# Patient Record
Sex: Female | Born: 1965
Health system: Southern US, Community
[De-identification: ages and names within clinical notes are randomized; demographics above are authoritative.]

## PROBLEM LIST (undated history)

## (undated) DIAGNOSIS — K219 Gastro-esophageal reflux disease without esophagitis: Secondary | ICD-10-CM

## (undated) DIAGNOSIS — F419 Anxiety disorder, unspecified: Secondary | ICD-10-CM

## (undated) DIAGNOSIS — Z860100 Personal history of colon polyps, unspecified: Secondary | ICD-10-CM

## (undated) DIAGNOSIS — M1990 Unspecified osteoarthritis, unspecified site: Secondary | ICD-10-CM

## (undated) DIAGNOSIS — H9209 Otalgia, unspecified ear: Secondary | ICD-10-CM

## (undated) DIAGNOSIS — E079 Disorder of thyroid, unspecified: Secondary | ICD-10-CM

## (undated) HISTORY — DX: Anxiety disorder, unspecified: F41.9

## (undated) HISTORY — DX: Personal history of colonic polyps: Z86.010

## (undated) HISTORY — DX: Personal history of colon polyps, unspecified: Z86.0100

## (undated) HISTORY — DX: Unspecified osteoarthritis, unspecified site: M19.90

## (undated) HISTORY — DX: Disorder of thyroid, unspecified: E07.9

## (undated) HISTORY — PX: CHOLECYSTECTOMY: SHX55

## (undated) HISTORY — DX: Otalgia, unspecified ear: H92.09

## (undated) HISTORY — PX: OTHER SURGICAL HISTORY: SHX169

## (undated) HISTORY — DX: Gastro-esophageal reflux disease without esophagitis: K21.9

## (undated) HISTORY — PX: HERNIA REPAIR: SHX51

---

## 2000-04-02 ENCOUNTER — Other Ambulatory Visit: Admission: RE | Admit: 2000-04-02 | Discharge: 2000-04-02 | Payer: Self-pay | Admitting: Family Medicine

## 2001-05-27 ENCOUNTER — Encounter: Payer: Self-pay | Admitting: Obstetrics and Gynecology

## 2001-05-27 ENCOUNTER — Ambulatory Visit (HOSPITAL_COMMUNITY): Admission: RE | Admit: 2001-05-27 | Discharge: 2001-05-27 | Payer: Self-pay | Admitting: *Deleted

## 2001-08-11 ENCOUNTER — Ambulatory Visit (HOSPITAL_COMMUNITY): Admission: RE | Admit: 2001-08-11 | Discharge: 2001-08-11 | Payer: Self-pay | Admitting: Obstetrics and Gynecology

## 2001-08-11 ENCOUNTER — Encounter: Payer: Self-pay | Admitting: Obstetrics and Gynecology

## 2001-08-14 ENCOUNTER — Inpatient Hospital Stay (HOSPITAL_COMMUNITY): Admission: AD | Admit: 2001-08-14 | Discharge: 2001-08-16 | Payer: Self-pay | Admitting: Obstetrics and Gynecology

## 2001-09-18 ENCOUNTER — Other Ambulatory Visit: Admission: RE | Admit: 2001-09-18 | Discharge: 2001-09-18 | Payer: Self-pay | Admitting: Obstetrics and Gynecology

## 2004-04-23 ENCOUNTER — Ambulatory Visit (HOSPITAL_COMMUNITY): Admission: RE | Admit: 2004-04-23 | Discharge: 2004-04-23 | Payer: Self-pay | Admitting: Family Medicine

## 2004-08-26 ENCOUNTER — Emergency Department (HOSPITAL_COMMUNITY): Admission: EM | Admit: 2004-08-26 | Discharge: 2004-08-26 | Payer: Self-pay | Admitting: Emergency Medicine

## 2005-02-15 ENCOUNTER — Other Ambulatory Visit: Admission: RE | Admit: 2005-02-15 | Discharge: 2005-02-15 | Payer: Self-pay | Admitting: Obstetrics and Gynecology

## 2010-06-13 ENCOUNTER — Ambulatory Visit (HOSPITAL_COMMUNITY): Admission: RE | Admit: 2010-06-13 | Discharge: 2010-06-13 | Payer: Self-pay | Admitting: Surgery

## 2010-06-25 ENCOUNTER — Ambulatory Visit (HOSPITAL_COMMUNITY): Admission: RE | Admit: 2010-06-25 | Discharge: 2010-06-25 | Payer: Self-pay | Admitting: Surgery

## 2010-07-05 ENCOUNTER — Encounter: Admission: RE | Admit: 2010-07-05 | Discharge: 2010-08-23 | Payer: Self-pay | Admitting: Surgery

## 2010-07-10 ENCOUNTER — Ambulatory Visit (HOSPITAL_BASED_OUTPATIENT_CLINIC_OR_DEPARTMENT_OTHER): Admission: RE | Admit: 2010-07-10 | Discharge: 2010-07-10 | Payer: Self-pay | Admitting: Surgery

## 2010-07-15 ENCOUNTER — Ambulatory Visit: Payer: Self-pay | Admitting: Internal Medicine

## 2010-09-27 ENCOUNTER — Encounter
Admission: RE | Admit: 2010-09-27 | Discharge: 2010-12-25 | Payer: Self-pay | Source: Home / Self Care | Attending: Surgery | Admitting: Surgery

## 2010-10-16 ENCOUNTER — Ambulatory Visit (HOSPITAL_COMMUNITY)
Admission: RE | Admit: 2010-10-16 | Discharge: 2010-10-17 | Payer: Self-pay | Source: Home / Self Care | Admitting: Surgery

## 2010-10-30 ENCOUNTER — Encounter
Admission: RE | Admit: 2010-10-30 | Discharge: 2010-12-25 | Payer: Self-pay | Source: Home / Self Care | Attending: Surgery | Admitting: Surgery

## 2011-01-23 ENCOUNTER — Encounter: Admit: 2011-01-23 | Payer: Self-pay | Admitting: Surgery

## 2011-02-05 LAB — COMPREHENSIVE METABOLIC PANEL
ALT: 22 U/L (ref 0–35)
Albumin: 4 g/dL (ref 3.5–5.2)
BUN: 8 mg/dL (ref 6–23)
Calcium: 9.6 mg/dL (ref 8.4–10.5)
GFR calc non Af Amer: >60 mL/min (ref >60)
Glucose, Bld: 86 mg/dL (ref 70–99)
Potassium: 4.4 mEq/L (ref 3.5–5.1)

## 2011-02-05 LAB — DIFFERENTIAL
Basophils Absolute: 0 10*3/uL (ref 0.0–0.1)
Basophils Relative: 0 % (ref 0–1)
Eosinophils Relative: 0 % (ref 0–5)
Eosinophils Relative: 1 % (ref 0–5)
Lymphs Abs: 1 10*3/uL (ref 0.7–4.0)
Lymphs Abs: 1.1 10*3/uL (ref 0.7–4.0)
Monocytes Absolute: 0.5 10*3/uL (ref 0.1–1.0)
Monocytes Absolute: 0.7 10*3/uL (ref 0.1–1.0)
Monocytes Relative: 12 % (ref 3–12)
Neutro Abs: 3 10*3/uL (ref 1.7–7.7)
Neutrophils Relative %: 61 % (ref 43–77)

## 2011-02-05 LAB — CBC
Hemoglobin: 11.3 g/dL — ABNORMAL LOW (ref 12.0–15.0)
MCH: 27.8 pg (ref 26.0–34.0)
MCHC: 33.7 g/dL (ref 30.0–36.0)
MCV: 82.4 fL (ref 78.0–100.0)
Platelets: 160 10*3/uL (ref 150–400)
RDW: 15.7 % — ABNORMAL HIGH (ref 11.5–15.5)
WBC: 4.3 10*3/uL (ref 4.0–10.5)
WBC: 4.9 10*3/uL (ref 4.0–10.5)

## 2011-02-20 ENCOUNTER — Encounter: Payer: 59 | Attending: Surgery | Admitting: *Deleted

## 2011-02-20 DIAGNOSIS — Z9884 Bariatric surgery status: Secondary | ICD-10-CM | POA: Insufficient documentation

## 2011-02-20 DIAGNOSIS — Z713 Dietary counseling and surveillance: Secondary | ICD-10-CM | POA: Insufficient documentation

## 2011-02-20 DIAGNOSIS — Z09 Encounter for follow-up examination after completed treatment for conditions other than malignant neoplasm: Secondary | ICD-10-CM | POA: Insufficient documentation

## 2011-04-12 NOTE — Discharge Summary (Signed)
[facility]  Patient:    Stephanie Little, Stephanie Little Visit Number: [id number] MRN: [id number]          Service Type: OBS Location: 910A [id number] [id number] Attending Physician:  Melony Overly Admit Date:  08/14/2001 Discharge Date: 08/16/2001                             Discharge Summary  ADMISSION DIAGNOSES:          1. Intrauterine pregnancy at 39-4/7 weeks.                               2. Suspected macrosomia.  FINDINGS:                     1. Intrauterine pregnancy at 39-4/7 weeks.                               2. Macrosomia.                               3. Delivery of viable female infant weighing                                  8 pounds 11 ounces, Apgars 9 at one minute                                  and 9 at five minutes.  BRIEF HISTORY:                The patient is a 45 year old female, gravida 1, para 0, at 39-4/7 weeks who on ultrasound had an estimated fetal weight of 4096 grams, greater than 96th percentile.  There was no palpable presenting part noted in the pelvis and the cervix was noted to be long and thick.  In view of this suspected large infant and primigravida with no presenting part in the pelvis, the risks and benefits of attempted vaginal delivery versus cesarean section were discussed and the patient opted to undergo primary cesarean section.  HOSPITAL COURSE:              On August 14, 2001, she was taken to the operating room where under spinal anesthesia, a primary low transverse cesarean section was carried out with delivery of a viable female infant weighing 8 pounds 11 ounces.  The patients postoperative course was essentially uncomplicated.  She was tolerating increasing ambulation and diet well and by September 22, was in satisfactory condition and felt stable enough to be sent home.  DISCHARGE MEDICATIONS:        Tylox one every three to four hours as needed                               for pain.  DISCHARGE INSTRUCTIONS:        She was instructed to do no heavy lifting and to call if there were any problems such as fever, pain, or heavy bleeding.  The patient is to be seen back for follow-up examination in five to six weeks. Attending Physician:  Conley Simmonds  A DD:  09/09/01 TD:  09/09/01 Job: [id number]

## 2011-04-12 NOTE — Op Note (Signed)
[facility]  Patient:    Stephanie Little, Stephanie Little Visit Number: [id number] MRN: [id number]          Service Type: OBS Location: [id number] [id number] Attending Physician:  Melony Overly Dictated by:   Devoria Albe Edward Jolly, M.D. Proc. Date: 08/14/01 Admit Date:  08/14/2001                             Operative Report  PREOPERATIVE DIAGNOSES:       1. Intrauterine gestation at 39+4 weeks.                               2. Suspected fetal macrosomia.  POSTOPERATIVE DIAGNOSES:      1. Intrauterine gestation at 39+4 weeks.                               2. Suspected fetal macrosomia.  PROCEDURE:                    Primary low segment transverse cesarean section.  SURGEON:                      Brook A. Edward Jolly, M.D.  ASSISTANT:                    Gerlene Burdock D. Arlyce Dice, M.D.  ANESTHESIA:                   Spinal.  INDICATIONS FOR PROCEDURE:    The patient was a 45 year old gravida 1, para 39 Caucasian female at 39+[redacted] weeks gestation ([facility] August 17, 2001 by last menstrual period) who presented with an estimated fetal weight of 4096 g which was consistent with greater than the 90th percentile and a normal amniotic fluid index of 12.3 on August 11, 2001.  The fetus was noted to be vertex by ultrasound.  The cervix on examination was noted to be closed, long, and thick with no palpable presenting part in the pelvis.  The patients one hour GTT was normal during her antepartum course.  A discussion was held with the patient regarding the suspected fetal macrosomia and the poor Bishop score and a decision was made to proceed with a primary low segment transverse cesarean section after the risks, benefits, and alternatives were discussed with her.  FINDINGS:                     Viable female infant was delivered at 9:27 with Apgars of 9 at one minute and 9 at five minutes.  The weight was 8 pounds 11 ounces.  The placenta had the normal insertion of a three vessel cord.  The tubes,  ovaries, and uterus were normal.  SPECIMEN:                     None.  PROCEDURE:                    With an IV in place the patient was taken to the operating room after she was properly identified.  The patient received a spinal anesthetic and she was then placed in the supine position.  The abdomen was sterilely prepped and a Foley catheter sterilely placed inside the bladder.  She was then sterilely draped.  After adequate  anesthesia was ensured, a Pfannenstiel incision was created sharply with a scalpel.  This was carried down to the fascia using the scalpel and monopolar cautery.  The fascia was scored in the midline with the scalpel. The incision was carried out bilaterally using Mayo scissors.  The rectus fascia was separated rectus muscles using sharp dissection with the Mayo scissors.  The rectus muscles were then divided in the midline using the same both superiorly and inferiorly.  The parietoperitoneum was identified and grasped with two hemostat clamps.  It was entered sharply with the Metzenbaum scissors.  The incision was then extended cranially and caudally using the same.  The bladder retractor was then placed and the lower uterine segment was exposed.  The bladder flap was created sharply with Metzenbaum scissors.  A transverse incision was then created in the lower uterine segment using the scalpel and the incision was carried out bilaterally using bandage scissors. The membranes were ruptured with an Allis clamp and clear fluid was appreciated.  A hand was inserted and the vertex was elevated.  The vertex was noted to be high in the pelvis.  Therefore a vacuum was placed over the vertex.  Adequate pressure was applied and the vertex was delivered without difficulty.  The nares and mouth were suctioned.  The remainder of the infant was delivered.  The cord was doubly clamped and cut.  The infant was carried over to the awaiting pediatricians.  The infant was noted  to be vigorous.  Cord blood was obtained at this time.  The patient did receive cefotetan 1 g IV.  The placenta was manually extracted.  The patient received Pitocin 20 units IV.  A moistened lap pad was used to ensure that no remaining membranes were within the uterine cavity.  The uterus was then closed with a running locked suture of #1 Chromic.  Hemostasis was excellent.  The uterus which had been exteriorized for closure of the uterine incision was then returned to the peritoneal cavity.  The peritoneal cavity was irrigated and suctioned.  Hemostasis was noted to be adequate.  The fascia was therefore closed at this time with a running suture of 0 Vicryl.  Subcutaneous tissue was irrigated and found to be hemostatic.  Interrupted suture of 3-0 plain were placed in a subcutaneous layer followed by staples in the skin.  A sterile bandage was placed.  The uterus was expressed of any remaining clots.  The patient was escorted to the recovery room in stable and awake condition.  There were no complications of the procedure.  All needle, instrument, and lap counts were correct. Dictated by:   Devoria Albe Edward Jolly, M.D. Attending Physician:  Melony Overly DD:  08/14/01 TD:  08/14/01 Job: [id number]

## 2011-05-16 ENCOUNTER — Ambulatory Visit: Payer: 59 | Admitting: *Deleted

## 2011-06-27 ENCOUNTER — Encounter (INDEPENDENT_AMBULATORY_CARE_PROVIDER_SITE_OTHER): Payer: Self-pay

## 2011-06-28 ENCOUNTER — Encounter (INDEPENDENT_AMBULATORY_CARE_PROVIDER_SITE_OTHER): Payer: Self-pay

## 2011-06-28 ENCOUNTER — Ambulatory Visit (INDEPENDENT_AMBULATORY_CARE_PROVIDER_SITE_OTHER): Payer: 59 | Admitting: Physician Assistant

## 2011-06-28 NOTE — Progress Notes (Signed)
  HISTORY: Stephanie Little is a 45 y.o.female who received an AP-Standard lap-band in November 2011 by Dr. Daphine Deutscher. She comes in today with no complaints and with a 6 lb weight loss since her last appointment. She denies vomiting or regurgitation symptoms. Her hunger is in good control as are her portion sizes.   VITAL SIGNS: Filed Vitals:   06/28/11 0844  BP: 116/78    PHYSICAL EXAM: Physical exam reveals a very well-appearing 45 y.o.female in no apparent distress Neurologic: Awake, alert, oriented Psych: Bright affect, conversant Respiratory: Breathing even and unlabored. No stridor or wheezing Extremities: Atraumatic, good range of motion. Skin: Warm, Dry, no rashes Musculoskeletal: Normal gait, Joints normal  ASSESMENT: 45 y.o.  female  s/p AP-Standard lap-band. She is doing very well  PLAN: We opted to forego an adjustment today as her hunger is under good control and her portion sizes remain small. We'll have her back in 4-6 weeks or sooner if her hunger increases. She voiced understanding and agreement.

## 2011-06-28 NOTE — Patient Instructions (Signed)
Return as scheduled. Call for an appointment sooner if you notice increasing appetite/portion sizes or persistent hunger.

## 2011-07-26 ENCOUNTER — Encounter (INDEPENDENT_AMBULATORY_CARE_PROVIDER_SITE_OTHER): Payer: 59

## 2011-08-02 ENCOUNTER — Ambulatory Visit (INDEPENDENT_AMBULATORY_CARE_PROVIDER_SITE_OTHER): Payer: 59 | Admitting: Physician Assistant

## 2011-08-02 ENCOUNTER — Encounter (INDEPENDENT_AMBULATORY_CARE_PROVIDER_SITE_OTHER): Payer: Self-pay | Admitting: Physician Assistant

## 2011-08-02 NOTE — Patient Instructions (Signed)
Return in 1 month or sooner if necessary. Follow-up with Amy with Nutrition. Please call her for an appointment.

## 2011-08-02 NOTE — Progress Notes (Signed)
  HISTORY: Stephanie Little is a 46 y.o.female who received an AP-Standard lap-band in November 2011 by Dr. Daphine Deutscher. She comes in today with no complaints of hunger or increased portion sizes however she says her food choices have been less than ideal.  VITAL SIGNS: Filed Vitals:   08/02/11 0904  BP: 122/86  Pulse: 62    PHYSICAL EXAM: Physical exam reveals a very well-appearing 45 y.o.female in no apparent distress Neurologic: Awake, alert, oriented Psych: Bright affect, conversant Respiratory: Breathing even and unlabored. No stridor or wheezing Extremities: Atraumatic, good range of motion. Skin: Warm, Dry, no rashes Musculoskeletal: Normal gait, Joints normal  ASSESMENT: 45 y.o.  female  s/p AP-Standard lap-band.   PLAN: She describes being in the green zone although her choices at meals and throughout the day aren't great (i.e. Large diet coke every day, relying on protein bars, etc.). We'll have her back next month to check her progress in terms of food choices. Hopefully before then she'll have an opportunity to meet with Royal Hawthorn, LD and get back on track.

## 2011-08-30 ENCOUNTER — Encounter (INDEPENDENT_AMBULATORY_CARE_PROVIDER_SITE_OTHER): Payer: Self-pay

## 2011-08-30 ENCOUNTER — Ambulatory Visit (INDEPENDENT_AMBULATORY_CARE_PROVIDER_SITE_OTHER): Payer: 59 | Admitting: Physician Assistant

## 2011-08-30 NOTE — Progress Notes (Signed)
  HISTORY: Stephanie Little is a 45 y.o.female who received an AP-Standard lap-band in November 2011 by Dr. Daphine Deutscher. She comes in with no new complaints and a weight loss of nearly 4 lbs since her last visit. She denies persistent vomiting or regurgitation symptoms. Her work schedule is pretty intense and makes it difficult for her to get exercise.  VITAL SIGNS: Filed Vitals:   08/30/11 0924  BP: 106/70  Pulse: 60  Temp: 98 F (36.7 C)  Resp: 18    PHYSICAL EXAM: Physical exam reveals a very well-appearing 45 y.o.female in no apparent distress Neurologic: Awake, alert, oriented Psych: Bright affect, conversant Respiratory: Breathing even and unlabored. No stridor or wheezing Extremities: Atraumatic, good range of motion. Skin: Warm, Dry, no rashes Musculoskeletal: Normal gait, Joints normal  ASSESMENT: 45 y.o.  female  s/p AP-Standard lap-band.   PLAN: We deferred an adjustment at this time as she believes the band is working as expected. We'll have her return in one month for her one year anniversary appointment.

## 2011-08-30 NOTE — Patient Instructions (Signed)
Return in 1 month or sooner if needed.

## 2011-09-27 ENCOUNTER — Encounter (INDEPENDENT_AMBULATORY_CARE_PROVIDER_SITE_OTHER): Payer: Self-pay | Admitting: Physician Assistant

## 2011-09-27 ENCOUNTER — Ambulatory Visit (INDEPENDENT_AMBULATORY_CARE_PROVIDER_SITE_OTHER): Payer: 59 | Admitting: Physician Assistant

## 2011-09-27 VITALS — BP 110/74 | HR 72 | Resp 18 | Ht 70.0 in | Wt 241.2 lb

## 2011-09-27 DIAGNOSIS — E669 Obesity, unspecified: Secondary | ICD-10-CM

## 2011-09-27 DIAGNOSIS — Z4651 Encounter for fitting and adjustment of gastric lap band: Secondary | ICD-10-CM

## 2011-09-27 NOTE — Patient Instructions (Signed)
Take clear liquids for the next 48 hours. Thin protein shakes are ok to start on Saturday evening. Call us if you have persistent vomiting or regurgitation, night cough or reflux symptoms. Return as scheduled or sooner if you notice no changes in hunger/portion sizes.   

## 2011-09-27 NOTE — Progress Notes (Signed)
  HISTORY: Stephanie Little is a 45 y.o.female who received an AP-Standard lap-band in November 2011 by Dr. Daphine Deutscher. She says her portion sizes and hunger have increased with no increase in untoward symptoms. She would like an adjustment today.  VITAL SIGNS: Filed Vitals:   09/27/11 1020  BP: 110/74  Pulse: 72  Resp: 18    PHYSICAL EXAM: Physical exam reveals a very well-appearing 45 y.o.female in no apparent distress Neurologic: Awake, alert, oriented Psych: Bright affect, conversant Respiratory: Breathing even and unlabored. No stridor or wheezing Abdomen: Soft, nontender, nondistended to palpation. Incisions well-healed. No incisional hernias. Port easily palpated. Extremities: Atraumatic, good range of motion.  ASSESMENT: 45 y.o.  female  s/p AP-Standard lap-band.   PLAN: The patient's port was accessed with a 20G Huber needle without difficulty. Clear fluid was aspirated and 0.5 mL saline was added to the port. The patient was able to swallow water without difficulty following the procedure and was instructed to take clear liquids for the next 24-48 hours and advance slowly as tolerated.

## 2011-10-04 ENCOUNTER — Encounter (INDEPENDENT_AMBULATORY_CARE_PROVIDER_SITE_OTHER): Payer: 59

## 2011-10-25 ENCOUNTER — Ambulatory Visit (INDEPENDENT_AMBULATORY_CARE_PROVIDER_SITE_OTHER): Payer: 59 | Admitting: Physician Assistant

## 2011-10-25 ENCOUNTER — Encounter (INDEPENDENT_AMBULATORY_CARE_PROVIDER_SITE_OTHER): Payer: Self-pay

## 2011-10-25 VITALS — BP 114/76 | Ht 70.0 in | Wt 234.4 lb

## 2011-10-25 DIAGNOSIS — E669 Obesity, unspecified: Secondary | ICD-10-CM

## 2011-10-25 NOTE — Patient Instructions (Signed)
Return in 3 months or sooner if needed.

## 2011-10-25 NOTE — Progress Notes (Signed)
  HISTORY: Stephanie Little is a 45 y.o.female who received an AP-Standard lap-band in November 2011 by Dr. Daphine Deutscher. She comes in today with no new complaints. Her portion sizes are small and she's having no untoward symtpoms.  VITAL SIGNS: Filed Vitals:   10/25/11 1050  BP: 114/76    PHYSICAL EXAM: Physical exam reveals a very well-appearing 45 y.o.female in no apparent distress Neurologic: Awake, alert, oriented Psych: Bright affect, conversant Respiratory: Breathing even and unlabored. No stridor or wheezing Extremities: Atraumatic, good range of motion. Skin: Warm, Dry, no rashes Musculoskeletal: Normal gait, Joints normal  ASSESMENT: 45 y.o.  female  s/p AP-Standard lap-band.   PLAN: No adjustment today as her band is in the green zone. We'll have her back in 3 months for routine follow-up.

## 2011-12-26 ENCOUNTER — Encounter (INDEPENDENT_AMBULATORY_CARE_PROVIDER_SITE_OTHER): Payer: Self-pay | Admitting: General Surgery

## 2012-02-27 ENCOUNTER — Encounter (INDEPENDENT_AMBULATORY_CARE_PROVIDER_SITE_OTHER): Payer: Self-pay

## 2012-02-27 ENCOUNTER — Ambulatory Visit (INDEPENDENT_AMBULATORY_CARE_PROVIDER_SITE_OTHER): Payer: 59 | Admitting: Physician Assistant

## 2012-02-27 NOTE — Progress Notes (Signed)
  HISTORY: Stephanie Little is a 46 y.o.female who received an AP-Standard lap-band in November 2011 by Dr. Daphine Deutscher. She comes in with no complaints of regurgitation or vomiting. She says her thyroid medication is still being adjusted and believes this may be contributing to her slow weight loss. She isn't particularly hungry and isn't eating large volumes of food. She doesn't believe an adjustment is needed today.Marland Kitchen  VITAL SIGNS: Filed Vitals:   02/27/12 0846  BP: 108/67  Pulse: 76  Temp: 98 F (36.7 C)    PHYSICAL EXAM: Physical exam reveals a very well-appearing 46 y.o.female in no apparent distress Neurologic: Awake, alert, oriented Psych: Bright affect, conversant Respiratory: Breathing even and unlabored. No stridor or wheezing Extremities: Atraumatic, good range of motion. Skin: Warm, Dry, no rashes Musculoskeletal: Normal gait, Joints normal  ASSESMENT: 46 y.o.  female  s/p AP-Standard lap-band.   PLAN: We deferred an adjustment today. We'll have her back in three months or sooner if needed.

## 2012-02-27 NOTE — Patient Instructions (Signed)
Return in 3 months or sooner if needed.

## 2012-05-21 ENCOUNTER — Encounter (INDEPENDENT_AMBULATORY_CARE_PROVIDER_SITE_OTHER): Payer: 59

## 2012-06-25 ENCOUNTER — Encounter (INDEPENDENT_AMBULATORY_CARE_PROVIDER_SITE_OTHER): Payer: Self-pay

## 2012-06-25 ENCOUNTER — Ambulatory Visit (INDEPENDENT_AMBULATORY_CARE_PROVIDER_SITE_OTHER): Payer: 59 | Admitting: Physician Assistant

## 2012-06-25 NOTE — Patient Instructions (Signed)
Return in three months or sooner if needed, especially if you have difficulty swallowing.

## 2012-06-25 NOTE — Progress Notes (Signed)
  HISTORY: Stephanie Little is a 46 y.o.female who received an AP-Standard lap-band in November 2011 by Dr. Daphine Deutscher. She comes in with improvement in what appeared to be over-restriction. She said she had difficulty getting solids down but after her vacation she's able to eat without difficulty. She believes her dysphagia may be stress related.  VITAL SIGNS: Filed Vitals:   06/25/12 0852  BP: 126/82  Pulse: 72  Resp: 14    PHYSICAL EXAM: Physical exam reveals a very well-appearing 46 y.o.female in no apparent distress Neurologic: Awake, alert, oriented Psych: Bright affect, conversant Respiratory: Breathing even and unlabored. No stridor or wheezing Extremities: Atraumatic, good range of motion. Skin: Warm, Dry, no rashes Musculoskeletal: Normal gait, Joints normal  ASSESMENT: 46 y.o.  female  s/p AP-Standard lap-band.   PLAN: She sounds like she's in the green zone now so we'll leave the current fill level as is. I asked her to return in 3 months or sooner if needed. She voiced understanding and agreement.

## 2012-10-01 ENCOUNTER — Encounter (INDEPENDENT_AMBULATORY_CARE_PROVIDER_SITE_OTHER): Payer: 59

## 2012-10-08 ENCOUNTER — Encounter (INDEPENDENT_AMBULATORY_CARE_PROVIDER_SITE_OTHER): Payer: Self-pay

## 2012-10-08 ENCOUNTER — Ambulatory Visit (INDEPENDENT_AMBULATORY_CARE_PROVIDER_SITE_OTHER): Payer: 59 | Admitting: Physician Assistant

## 2012-10-08 DIAGNOSIS — Z9884 Bariatric surgery status: Secondary | ICD-10-CM

## 2012-10-08 NOTE — Progress Notes (Signed)
  HISTORY: Stephanie Little is a 46 y.o.female who received an AP-Standard lap-band in November 2011 by Dr. Daphine Deutscher. She comes in with occasional solid food dysphagia. She has undergone treatment for a sinus infection including prednisone which she has now discontinued. She has issues with solids while at work primarily. Weekends are more tolerable. She doesn't want fluid removed as now she's off the medication. She's planning on engaging in an exercise program as well.  VITAL SIGNS: Filed Vitals:   10/08/12 0925  BP: 112/76  Pulse: 75  Temp: 98 F (36.7 C)  Resp: 18    PHYSICAL EXAM: Physical exam reveals a very well-appearing 46 y.o.female in no apparent distress Neurologic: Awake, alert, oriented Psych: Bright affect, conversant Respiratory: Breathing even and unlabored. No stridor or wheezing Extremities: Atraumatic, good range of motion. Skin: Warm, Dry, no rashes Musculoskeletal: Normal gait, Joints normal  ASSESMENT: 46 y.o.  female  s/p AP-Standard lap-band.   PLAN: We deferred an adjustment today with an understanding that if her symptoms didn't progressively improve over the next week, she'll return for an adjustment. Otherwise we'll have her back in three months.

## 2012-10-08 NOTE — Patient Instructions (Signed)
Return in 3 months. Focus on good food choices as well as physical activity. Return sooner if you have an increase in hunger, portion sizes or weight. Return also for difficulty swallowing, night cough, reflux. 

## 2012-12-02 NOTE — Progress Notes (Signed)
Pt not seen by me

## 2013-01-07 ENCOUNTER — Encounter (INDEPENDENT_AMBULATORY_CARE_PROVIDER_SITE_OTHER): Payer: 59

## 2013-01-14 ENCOUNTER — Encounter (INDEPENDENT_AMBULATORY_CARE_PROVIDER_SITE_OTHER): Payer: 59

## 2013-02-18 ENCOUNTER — Encounter (INDEPENDENT_AMBULATORY_CARE_PROVIDER_SITE_OTHER): Payer: Self-pay

## 2013-02-18 ENCOUNTER — Ambulatory Visit (INDEPENDENT_AMBULATORY_CARE_PROVIDER_SITE_OTHER): Payer: 59 | Admitting: Physician Assistant

## 2013-02-18 VITALS — BP 110/84 | HR 82 | Temp 98.0°F | Resp 18 | Ht 70.0 in | Wt 230.6 lb

## 2013-02-18 DIAGNOSIS — Z4651 Encounter for fitting and adjustment of gastric lap band: Secondary | ICD-10-CM

## 2013-02-18 NOTE — Progress Notes (Signed)
  HISTORY: Stephanie Little is a 47 y.o.female who received an AP-Standard lap-band in November 2011 by Dr. Daphine Deutscher. She comes in with some evidence of over-restriction. She is eating relatively little solid food as it tends to get stuck. She's eating a fair bit of sweets. Vegetables are difficult and lean chicken is intolerable. She's concerned about weight gain but she'd like to be able to eat more nutritious foods as well.  VITAL SIGNS: Filed Vitals:   02/18/13 0847  BP: 110/84  Pulse: 82  Temp: 98 F (36.7 C)  Resp: 18    PHYSICAL EXAM: Physical exam reveals a very well-appearing 47 y.o.female in no apparent distress Neurologic: Awake, alert, oriented Psych: Bright affect, conversant Respiratory: Breathing even and unlabored. No stridor or wheezing Abdomen: Soft, nontender, nondistended to palpation. Incisions well-healed. No incisional hernias. Port easily palpated. Extremities: Atraumatic, good range of motion.  ASSESMENT: 47 y.o.  female  s/p AP-Standard lap-band.   PLAN: The patient's port was accessed with a 20G Huber needle without difficulty. Clear fluid was aspirated and 0.25 mL saline was removed from the port. We'll have her back in three months or sooner if needed.

## 2013-02-18 NOTE — Patient Instructions (Signed)
Take clear liquids tonight. Thin protein shakes are ok to start tomorrow morning. Slowly advance your diet thereafter. Call us if you have persistent vomiting or regurgitation, night cough or reflux symptoms. Return as scheduled or sooner if you notice no changes in hunger/portion sizes.  

## 2013-02-24 ENCOUNTER — Emergency Department (HOSPITAL_COMMUNITY): Payer: 59

## 2013-02-24 ENCOUNTER — Emergency Department (HOSPITAL_COMMUNITY)
Admission: EM | Admit: 2013-02-24 | Discharge: 2013-02-24 | Disposition: A | Discharge status: Home / Self Care | Payer: 59 | Attending: Emergency Medicine | Admitting: Emergency Medicine

## 2013-02-24 ENCOUNTER — Encounter (HOSPITAL_COMMUNITY): Payer: Self-pay | Admitting: *Deleted

## 2013-02-24 DIAGNOSIS — Z8669 Personal history of other diseases of the nervous system and sense organs: Secondary | ICD-10-CM | POA: Insufficient documentation

## 2013-02-24 DIAGNOSIS — R1013 Epigastric pain: Secondary | ICD-10-CM | POA: Insufficient documentation

## 2013-02-24 DIAGNOSIS — Z9884 Bariatric surgery status: Secondary | ICD-10-CM | POA: Insufficient documentation

## 2013-02-24 DIAGNOSIS — R110 Nausea: Secondary | ICD-10-CM | POA: Insufficient documentation

## 2013-02-24 DIAGNOSIS — Z79899 Other long term (current) drug therapy: Secondary | ICD-10-CM | POA: Insufficient documentation

## 2013-02-24 DIAGNOSIS — Z9089 Acquired absence of other organs: Secondary | ICD-10-CM | POA: Insufficient documentation

## 2013-02-24 DIAGNOSIS — Z9889 Other specified postprocedural states: Secondary | ICD-10-CM | POA: Insufficient documentation

## 2013-02-24 DIAGNOSIS — Z8719 Personal history of other diseases of the digestive system: Secondary | ICD-10-CM | POA: Insufficient documentation

## 2013-02-24 DIAGNOSIS — E079 Disorder of thyroid, unspecified: Secondary | ICD-10-CM | POA: Insufficient documentation

## 2013-02-24 DIAGNOSIS — R55 Syncope and collapse: Secondary | ICD-10-CM | POA: Insufficient documentation

## 2013-02-24 DIAGNOSIS — R109 Unspecified abdominal pain: Secondary | ICD-10-CM

## 2013-02-24 LAB — POCT I-STAT, CHEM 8
BUN: 7 mg/dL (ref 6–23)
Calcium, Ion: 1.1 mmol/L — ABNORMAL LOW (ref 1.12–1.23)
Creatinine, Ser: 0.6 mg/dL (ref 0.50–1.10)
Glucose, Bld: 96 mg/dL (ref 70–99)
HCT: 35 % — ABNORMAL LOW (ref 36.0–46.0)
Hemoglobin: 11.9 g/dL — ABNORMAL LOW (ref 12.0–15.0)
Potassium: 3.7 mEq/L (ref 3.5–5.1)

## 2013-02-24 LAB — CBC WITH DIFFERENTIAL/PLATELET
Basophils Absolute: 0 10*3/uL (ref 0.0–0.1)
Basophils Relative: 0 % (ref 0–1)
Eosinophils Absolute: 0.1 10*3/uL (ref 0.0–0.7)
Eosinophils Relative: 1 % (ref 0–5)
MCH: 26.5 pg (ref 26.0–34.0)
MCV: 78.2 fL (ref 78.0–100.0)
Monocytes Relative: 11 % (ref 3–12)
Neutrophils Relative %: 68 % (ref 43–77)

## 2013-02-24 LAB — HEPATIC FUNCTION PANEL
ALT: 19 U/L (ref 0–35)
Albumin: 3.3 g/dL — ABNORMAL LOW (ref 3.5–5.2)

## 2013-02-24 MED ORDER — ONDANSETRON HCL 4 MG/2ML IJ SOLN
4.0000 mg | Freq: Once | INTRAMUSCULAR | Status: AC
  Administered 2013-02-24: 4 mg via INTRAVENOUS
  Filled 2013-02-24: qty 2

## 2013-02-24 MED ORDER — SODIUM CHLORIDE 0.9 % IV BOLUS (SEPSIS)
1000.0000 mL | Freq: Once | INTRAVENOUS | Status: AC
  Administered 2013-02-24: 1000 mL via INTRAVENOUS
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
## 2013-02-24 NOTE — ED Provider Notes (Signed)
History     CSN: [id number]  Arrival date & time 02/24/13  1117   First MD Initiated Contact with Patient 02/24/13 1201      Chief Complaint  Patient presents with  . Loss of Consciousness    (Consider location/radiation/quality/duration/timing/severity/associated sxs/prior treatment) Patient is a 47 y.o. female presenting with syncope.  Loss of Consciousness    Pt with history of lap band had some fluid removed from her port about a week ago to help with over-restriction symptoms. She reports she has had difficulty with diet compliance due to increased hours at work. She had an episode this morning of severe sharp epigastric pain, associated with nausea and followed by syncopal episode. She was unconscious for a brief time, woke up feeling generally weak and nauseated. Pain has since resolved. She was reportedly hypotensive when EMS arrival. BP normalized enroute with 500cc NS. She is feeling better now.   Past Medical History  Diagnosis Date  . Thyroid disease   . Hemorrhoids   . Ear discomfort     fluid in ears    Past Surgical History  Procedure Laterality Date  . Cesarean section    . Cholecystectomy    . Laproscopic gastric banding    . Hernia repair      No family history on file.  History  Substance Use Topics  . Smoking status: Never Smoker   . Smokeless tobacco: Never Used  . Alcohol Use: No    OB History   Grav Para Term Preterm Abortions TAB SAB Ect Mult Living                  Review of Systems  Cardiovascular: Positive for syncope.   All other systems reviewed and are negative except as noted in HPI.   Allergies  Review of patient's allergies indicates no known allergies.  Home Medications   Current Outpatient Rx  Name  Route  Sig  Dispense  Refill  . citalopram (CELEXA) 10 MG tablet   Oral   Take 10 mg by mouth daily.         Marland Kitchen ibuprofen (ADVIL,MOTRIN) 800 MG tablet   Oral   Take 800 mg by mouth once as needed for pain.          Marland Kitchen levothyroxine (SYNTHROID, LEVOTHROID) 88 MCG tablet   Oral   Take 88 mcg by mouth daily before breakfast.           BP 110/64  Pulse 71  Temp(Src) 97.7 F (36.5 C) (Oral)  Resp 11  SpO2 97%  LMP 02/03/2013  Physical Exam  Nursing note and vitals reviewed. Constitutional: She is oriented to person, place, and time. She appears well-developed and well-nourished.  HENT:  Head: Normocephalic and atraumatic.  Eyes: EOM are normal. Pupils are equal, round, and reactive to light.  Neck: Normal range of motion. Neck supple.  Cardiovascular: Normal rate, normal heart sounds and intact distal pulses.   Pulmonary/Chest: Effort normal and breath sounds normal.  Abdominal: Bowel sounds are normal. She exhibits no distension. There is no tenderness.  Musculoskeletal: Normal range of motion. She exhibits no edema and no tenderness.  Neurological: She is alert and oriented to person, place, and time. She has normal strength. No cranial nerve deficit or sensory deficit.  Skin: Skin is warm and dry. No rash noted.  Psychiatric: She has a normal mood and affect.    ED Course  Procedures (including critical care time)  Labs Reviewed  CBC WITH  DIFFERENTIAL - Abnormal; Notable for the following:    Hemoglobin 11.3 (*)    HCT 33.4 (*)    All other components within normal limits  HEPATIC FUNCTION PANEL - Abnormal; Notable for the following:    Albumin 3.3 (*)    Total Bilirubin 0.2 (*)    All other components within normal limits  POCT I-STAT, CHEM 8 - Abnormal; Notable for the following:    Calcium, Ion 1.10 (*)    Hemoglobin 11.9 (*)    HCT 35.0 (*)    All other components within normal limits  GLUCOSE, CAPILLARY  LIPASE, BLOOD   Dg Chest 2 View  02/24/2013  **ADDENDUM** CREATED: 02/24/2013 14:29:17  The patient's gastric band is identified within the left upper quadrant of the abdomen.  This is oriented in the 2 o'clock to 8 o'clock position.  The band tubing appears intact.  **END  ADDENDUM** SIGNED BY: Rosealee Albee, M.D.   02/24/2013  *RADIOLOGY REPORT*  Clinical Data: Epigastric pain  CHEST - 2 VIEW  Comparison: 05/26/2010  Findings: The heart size and mediastinal contours are within normal limits.  Both lungs are clear.  The visualized skeletal structures are unremarkable.  IMPRESSION: Negative examination.   Original Report Authenticated By: Signa Kell, M.D.    Dg Abd 2 Views  02/24/2013  *RADIOLOGY REPORT*  Clinical Data: Abdominal pain  ABDOMEN - 2 VIEW  Comparison: 10/17/2010  Findings: There is nonspecific nonobstructive bowel gas pattern. Partially visualized gastric banding device tubing.  IMPRESSION: Nonspecific nonobstructive bowel gas pattern.   Original Report Authenticated By: Natasha Mead, M.D.      1. Abdominal pain   2. Vasovagal syncope       MDM  Abd benign now. Suspect this was a vaso-vagal syncope. Will check for cause of epigastric pain, AAS to assess position of Lap Band.    Date: 02/24/2013  Rate: 68  Rhythm: normal sinus rhythm  QRS Axis: normal  Intervals: normal  ST/T Wave abnormalities: normal  Conduction Disutrbances: none  Narrative Interpretation: unremarkable  2:50 PM Pt feeling much better. Xrays neg as above. She has been walking in the department without difficulty. Ready to go home. Advised to follow prescribed diet from Gen Surg and follow up with Dr. Daphine Deutscher as needed.         Charles B. Bernette Mayers, MD 02/24/13 1510

## 2013-02-24 NOTE — ED Notes (Signed)
Pt denies any dizziness, nausea while ambulating.  States feeling MUCH better.

## 2013-05-20 ENCOUNTER — Encounter (INDEPENDENT_AMBULATORY_CARE_PROVIDER_SITE_OTHER): Payer: Self-pay

## 2013-05-20 ENCOUNTER — Ambulatory Visit (INDEPENDENT_AMBULATORY_CARE_PROVIDER_SITE_OTHER): Payer: 59 | Admitting: Physician Assistant

## 2013-05-20 VITALS — BP 132/78 | HR 98 | Temp 98.6°F | Resp 16 | Ht 70.0 in | Wt 240.0 lb

## 2013-05-20 DIAGNOSIS — Z4651 Encounter for fitting and adjustment of gastric lap band: Secondary | ICD-10-CM

## 2013-05-20 NOTE — Progress Notes (Signed)
  HISTORY: Stephanie Little is a 47 y.o.female who received an AP-Standard lap-band in November 2011 by Dr. Daphine Deutscher. She comes in with 10 lbs weight gain since her last visit. She admits to eating more carbs, especially at the office. Her primary complaint, however, is persistent hunger. She has no regurgitation or reflux whatsoever. She wants a small fill today to get her weight under control. She had a syncopal episode at work associated with nausea, vomiting and diaphoresis. She relates being hypotensive at the hospital, with improvement from IV hydration. She denies regurgitation as the cause.  VITAL SIGNS: Filed Vitals:   05/20/13 0838  BP: 132/78  Pulse: 98  Temp: 98.6 F (37 C)  Resp: 16    PHYSICAL EXAM: Physical exam reveals a very well-appearing 47 y.o.female in no apparent distress Neurologic: Awake, alert, oriented Psych: Bright affect, conversant Respiratory: Breathing even and unlabored. No stridor or wheezing Abdomen: Soft, nontender, nondistended to palpation. Incisions well-healed. No incisional hernias. Port easily palpated. Extremities: Atraumatic, good range of motion.  ASSESMENT: 47 y.o.  female  s/p AP-Standard lap-band.   PLAN: The patient's port was accessed with a 20G Huber needle without difficulty. Clear fluid was aspirated and 0.25 mL saline was added to the port. The patient was able to swallow water without difficulty following the procedure and was instructed to take clear liquids for the next 24-48 hours and advance slowly as tolerated.

## 2013-05-20 NOTE — Patient Instructions (Signed)
Take clear liquids tonight. Thin protein shakes are ok to start tomorrow morning. Slowly advance your diet thereafter. Call us if you have persistent vomiting or regurgitation, night cough or reflux symptoms. Return as scheduled or sooner if you notice no changes in hunger/portion sizes.  

## 2013-08-19 ENCOUNTER — Encounter (INDEPENDENT_AMBULATORY_CARE_PROVIDER_SITE_OTHER): Payer: 59

## 2017-01-17 DIAGNOSIS — Z1231 Encounter for screening mammogram for malignant neoplasm of breast: Secondary | ICD-10-CM | POA: Diagnosis not present

## 2017-02-14 DIAGNOSIS — M25561 Pain in right knee: Secondary | ICD-10-CM | POA: Diagnosis not present

## 2017-05-15 ENCOUNTER — Encounter (HOSPITAL_COMMUNITY): Payer: Self-pay

## 2017-08-19 DIAGNOSIS — Z23 Encounter for immunization: Secondary | ICD-10-CM | POA: Diagnosis not present

## 2017-11-10 DIAGNOSIS — Z0000 Encounter for general adult medical examination without abnormal findings: Secondary | ICD-10-CM | POA: Diagnosis not present

## 2017-11-10 DIAGNOSIS — E039 Hypothyroidism, unspecified: Secondary | ICD-10-CM | POA: Diagnosis not present

## 2017-11-17 DIAGNOSIS — E039 Hypothyroidism, unspecified: Secondary | ICD-10-CM | POA: Diagnosis not present

## 2018-01-19 DIAGNOSIS — Z1231 Encounter for screening mammogram for malignant neoplasm of breast: Secondary | ICD-10-CM | POA: Diagnosis not present

## 2018-02-04 DIAGNOSIS — M25561 Pain in right knee: Secondary | ICD-10-CM | POA: Diagnosis not present

## 2018-08-11 DIAGNOSIS — Z23 Encounter for immunization: Secondary | ICD-10-CM | POA: Diagnosis not present

## 2018-10-19 DIAGNOSIS — M25561 Pain in right knee: Secondary | ICD-10-CM | POA: Diagnosis not present

## 2018-10-28 ENCOUNTER — Ambulatory Visit (INDEPENDENT_AMBULATORY_CARE_PROVIDER_SITE_OTHER): Payer: 59 | Admitting: Mental Health

## 2018-10-28 ENCOUNTER — Encounter: Payer: Self-pay | Admitting: Mental Health

## 2018-10-28 DIAGNOSIS — F411 Generalized anxiety disorder: Secondary | ICD-10-CM | POA: Diagnosis not present

## 2018-10-28 NOTE — Progress Notes (Signed)
[facility] Counselor Initial Adult Exam- Part I  Name: Stephanie Little Date: 10/28/2018 MRN: [id number] DOB: 1966/10/20 PCP: Ronita Hipps, MD  Time spent: 45 minutes   Guardian/Payee:patient    Paperwork requested:  No    Mental Status Exam:   Appearance:   Well Groomed     Behavior:  Agitated  Motor:  Restlestness  Speech/Language:   Clear and Coherent  Affect:  frustrated, upset  Mood:  anxious, depressed and irritable  Thought process:  normal  Thought content:    WNL  Sensory/Perceptual disturbances:    WNL  Orientation:  oriented to person, place and time/date  Attention:  Good  Concentration:  Good  Memory:  WNL  Fund of knowledge:   Good  Insight:    Good  Judgment:   Good  Impulse Control:  Good   Reported Symptoms:  Fatigue and upset  Risk Assessment: Danger to Self:  No Self-injurious Behavior: No Danger to Others: No Duty to Warn:no Physical Aggression / Violence:No  Access to Firearms a concern: No  Gang Involvement:No  Patient / guardian was educated about steps to take if suicide or homicide risk level increases between visits: no While future psychiatric events cannot be accurately predicted, the patient does not currently require acute inpatient psychiatric care and does not currently meet [facility] involuntary commitment criteria.  Substance Abuse History: Current substance abuse: No     Past Psychiatric History:   Previous psychological history is significant for anxiety Outpatient Providers:Dr. Jeral Pinch History of Psych Hospitalization: No  Psychological Testing: none    Medical History/Surgical History: Past Medical History:  Diagnosis Date  . Ear discomfort    fluid in ears  . Hemorrhoids   . Thyroid disease     Past Surgical History:  Procedure Laterality Date  . CESAREAN SECTION    . CHOLECYSTECTOMY    . HERNIA REPAIR    . laproscopic gastric banding      Medications: Current Outpatient Medications  Medication  Sig Dispense Refill  . citalopram (CELEXA) 10 MG tablet Take 10 mg by mouth daily.    Marland Kitchen ibuprofen (ADVIL,MOTRIN) 800 MG tablet Take 800 mg by mouth once as needed for pain.    Marland Kitchen levothyroxine (SYNTHROID, LEVOTHROID) 88 MCG tablet Take 88 mcg by mouth daily before breakfast.     No current facility-administered medications for this visit.     No Known Allergies  Diagnoses:    ICD-10-CM   1. Generalized anxiety disorder F41.1      Part II to be continued at next session:   No.   Rosary Lively, LPC                 Abuse History: Victim: none Report needed: no Perpetrator of abuse: no Witness / Exposure to Domestic Violence:  none Protective Services Involvement: no Witness to [employer] Violence:  no   Family / Social History:    Living situation: married, living with spouse Sexual Orientation: straight Relationship Status:   married Name of spouse / other: Stephanie Little Psychologist, counselling) If a parent, number of children / ages:   One son  Support Systems: Training and development officer Stress:   Expenses for son  Income/Employment/Disability:   Biochemist, clinical Service: No  Educational History:   [employer] and [employer]  Religion/Sprituality/World View:    Christian  Any cultural differences that may affect / interfere with treatment:  No  Recreation/Hobbies: Reading  Stressors:  Father has prostate cancer, son is reellious  Strengths:  Faith, Friendships  Barriers: none  Legal History: None personally  Pending legal issue / charges: none  History of legal issue / charges: son arrested for possession of paraphenalia, has o go to court  Diagnosis:  Generalized anxiety

## 2018-11-12 DIAGNOSIS — Z01419 Encounter for gynecological examination (general) (routine) without abnormal findings: Secondary | ICD-10-CM | POA: Diagnosis not present

## 2018-11-12 DIAGNOSIS — Z6836 Body mass index (BMI) 36.0-36.9, adult: Secondary | ICD-10-CM | POA: Diagnosis not present

## 2018-11-13 ENCOUNTER — Encounter: Payer: Self-pay | Admitting: Mental Health

## 2018-11-13 ENCOUNTER — Ambulatory Visit (INDEPENDENT_AMBULATORY_CARE_PROVIDER_SITE_OTHER): Payer: 59 | Admitting: Mental Health

## 2018-11-13 DIAGNOSIS — F411 Generalized anxiety disorder: Secondary | ICD-10-CM

## 2018-11-13 NOTE — Progress Notes (Signed)
      [facility] Counselor/Therapist Progress Note  Patient ID: Stephanie Little, MRN: [id number],    Date: 11/13/2018  Time Spent: 45 minutes  Treatment Type: Individual Therapy  Reported Symptoms: stressed, upset, pressured speech, interrupted sleep  Mental Status Exam:  Appearance:   Well Groomed     Behavior:  Appropriate, Sharing and Agitated  Motor:  Normal  Speech/Language:   Pressured  Affect:  Tearful and agtated  Mood:  anxious and depressed  Thought process:  goal directed  Thought content:    WNL  Sensory/Perceptual disturbances:    WNL  Orientation:  oriented to person, place and time/date  Attention:  Good  Concentration:  Good  Memory:  WNL  Fund of knowledge:   Good  Insight:    Good  Judgment:   Good  Impulse Control:  Good   Risk Assessment: Danger to Self:  No Self-injurious Behavior: No Danger to Others: No Duty to Warn:no Physical Aggression / Violence:No  Access to Firearms a concern: No  Gang Involvement:No   Subjective:  Had bad panic attack yesterday. High stress environment due to bipolar high school son's issues. Preparing for retirement. Has elderecare issues with parents and in-laws. High anxiety.  Interventions: Cognitive Behavioral Therapy, Solution-Oriented/Positive Psychology, Insight-Oriented, Interpersonal and supportive  Diagnosis:   ICD-10-CM   1. Generalized anxiety disorder F41.1     Plan: self-care program            Get appropriate help for son            Spiritual enrichment            Validation and support sources             Rosary Lively, [facility]

## 2018-12-09 DIAGNOSIS — Z111 Encounter for screening for respiratory tuberculosis: Secondary | ICD-10-CM | POA: Diagnosis not present

## 2018-12-17 ENCOUNTER — Ambulatory Visit: Payer: 59 | Admitting: Mental Health

## 2018-12-22 ENCOUNTER — Ambulatory Visit: Payer: 59 | Admitting: Mental Health

## 2018-12-22 ENCOUNTER — Encounter: Payer: Self-pay | Admitting: Mental Health

## 2018-12-22 DIAGNOSIS — F411 Generalized anxiety disorder: Secondary | ICD-10-CM | POA: Diagnosis not present

## 2018-12-22 NOTE — Progress Notes (Signed)
      [facility] Counselor/Therapist Progress Note  Patient ID: Stephanie Little, MRN: [id number],    Date: 12/22/2018  Time Spent: 45 minutes  Treatment Type: Individual Therapy  Reported Symptoms: Anxious Mood and Fatigue  Mental Status Exam:  Appearance:   Casual     Behavior:  Agitated  Motor:  Normal  Speech/Language:   Normal Rate  Affect:  Tearful and edgy  Mood:  anxious and irritable  Thought process:  normal  Thought content:    WNL  Sensory/Perceptual disturbances:    WNL  Orientation:  oriented to person, place and time/date  Attention:  Good  Concentration:  Good  Memory:  WNL  Fund of knowledge:   Good  Insight:    Good  Judgment:   Good  Impulse Control:  Good   Risk Assessment: Danger to Self:  No Self-injurious Behavior: No Danger to Others: No Duty to Warn:no Physical Aggression / Violence:No  Access to Firearms a concern: No  Gang Involvement:No   Subjective:  Son is continuing to smoke pot. Father's cancer has spread to lungs and liver. Retiring on Friday and starting a new job near her house. Frustrated with son. Using good boundaries and tough love.  Interventions: Cognitive Behavioral Therapy, Solution-Oriented/Positive Psychology, Insight-Oriented and Interpersonal  Diagnosis:   ICD-10-CM   1. Generalized anxiety disorder F41.1     Plan:   Self concept             Get appropriate help for son             Boundaries/ Setting             Codedendecy recovery             Validation and support                    Rosary Lively, [facility]

## 2019-01-11 ENCOUNTER — Ambulatory Visit: Payer: 59 | Admitting: Mental Health

## 2019-02-02 DIAGNOSIS — Z1231 Encounter for screening mammogram for malignant neoplasm of breast: Secondary | ICD-10-CM | POA: Diagnosis not present

## 2019-02-15 ENCOUNTER — Ambulatory Visit: Payer: 59 | Admitting: Mental Health

## 2019-02-16 ENCOUNTER — Ambulatory Visit: Payer: 59 | Admitting: Mental Health

## 2019-03-04 ENCOUNTER — Encounter: Payer: Self-pay | Admitting: Mental Health

## 2019-03-04 ENCOUNTER — Ambulatory Visit (INDEPENDENT_AMBULATORY_CARE_PROVIDER_SITE_OTHER): Payer: 59 | Admitting: Mental Health

## 2019-03-04 ENCOUNTER — Other Ambulatory Visit: Payer: Self-pay

## 2019-03-04 DIAGNOSIS — F411 Generalized anxiety disorder: Secondary | ICD-10-CM
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
## 2019-03-10 ENCOUNTER — Encounter: Payer: Self-pay | Admitting: Gastroenterology

## 2019-03-20 DIAGNOSIS — H6501 Acute serous otitis media, right ear: Secondary | ICD-10-CM | POA: Diagnosis not present

## 2019-03-25 ENCOUNTER — Ambulatory Visit (INDEPENDENT_AMBULATORY_CARE_PROVIDER_SITE_OTHER): Payer: 59 | Admitting: Mental Health

## 2019-03-25 ENCOUNTER — Other Ambulatory Visit: Payer: Self-pay

## 2019-03-25 ENCOUNTER — Encounter: Payer: Self-pay | Admitting: Mental Health

## 2019-03-25 DIAGNOSIS — F411 Generalized anxiety disorder: Secondary | ICD-10-CM
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
## 2019-04-01 ENCOUNTER — Other Ambulatory Visit: Payer: Self-pay

## 2019-04-01 ENCOUNTER — Ambulatory Visit (INDEPENDENT_AMBULATORY_CARE_PROVIDER_SITE_OTHER): Payer: 59 | Admitting: Mental Health

## 2019-04-01 ENCOUNTER — Encounter: Payer: Self-pay | Admitting: Mental Health

## 2019-04-01 DIAGNOSIS — F411 Generalized anxiety disorder: Secondary | ICD-10-CM | POA: Diagnosis not present
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
## 2019-04-20 ENCOUNTER — Ambulatory Visit: Payer: 59 | Admitting: Mental Health

## 2019-05-05 ENCOUNTER — Encounter: Payer: Self-pay | Admitting: Gastroenterology

## 2019-05-06 ENCOUNTER — Ambulatory Visit: Payer: 59 | Admitting: Mental Health

## 2019-05-17 ENCOUNTER — Other Ambulatory Visit: Payer: Self-pay

## 2019-05-17 ENCOUNTER — Encounter: Payer: Self-pay | Admitting: Mental Health

## 2019-05-17 ENCOUNTER — Ambulatory Visit (INDEPENDENT_AMBULATORY_CARE_PROVIDER_SITE_OTHER): Payer: 59 | Admitting: Mental Health

## 2019-05-17 DIAGNOSIS — F411 Generalized anxiety disorder: Secondary | ICD-10-CM | POA: Diagnosis not present

## 2019-05-17 NOTE — Progress Notes (Signed)
[facility] Counselor/Therapist Progress Note  Patient ID: Stephanie Little, MRN: [id number],    Date: 05/17/2019  Time Spent: 45 minutes  Treatment Type: Individual Therapy  Reported Symptoms: Reports "I think my body is calming down. I don't feel as tense." Has been very stressed for several months due to teenaged son's rebellion and involvement with drugs and his false allegations about his parents and his treatment at home. Sleeping like a rock now after months of interrupted sleep. Things moving in a positive direction now.  Mental Status Exam:  Appearance:   Casual     Behavior:  Appropriate, Sharing and Assertive  Motor:  Normal  Speech/Language:   Pressured  Affect:  Full Range  Mood:  anxious and labile  Thought process:  normal  Thought content:    WNL  Sensory/Perceptual disturbances:    WNL  Orientation:  oriented to person, place and time/date  Attention:  Good  Concentration:  Good  Memory:  WNL  Fund of knowledge:   Good  Insight:    Good  Judgment:   Good  Impulse Control:  Good   Risk Assessment: Danger to Self:  No Self-injurious Behavior: No Danger to Others: No Duty to Warn:no Physical Aggression / Violence:No  Access to Firearms a concern: No  Gang Involvement:No   Subjective:  Has had issues with her 53 year old son and DSS. He has been staying with his aunt and her has gotten off drugs and feels remorseful for the difficulties he has caused his parents. They have been going to court and involved with lawyers for the past several months. All are in counseling.  Interventions: Solution-Oriented/Positive Psychology, Insight-Oriented, Family Systems, Interpersonal and supportive  Diagnosis:   ICD-10-CM   1. Generalized anxiety disorder  F41.1       Treatment Plan   Patient Name: Stephanie Little   Date:  May 17, 2019  Didactic topic to be discussed:           Anxiety:                   Locus of control                               Work/Life balance           Depression                             Problem-solving                              Relationships                                   Boundaries                                     Coping srategies                             Communication                    Recovery from trauma  Self-care                                     Validation  Other: Addiction     Goals:  Patient  1. Maintains mood stabiity:  decreased symptoms of     depression     anxiety  2.   Practices pro-active self-care:   restful sleep, nutrition, exercise, socialization  3.   Effective utilizes boundaries and sets limits  4.   Utliizes coping strategies and problem solving techniques for stress management  5.   Feels accurately heard, understood and validated  Other      Logan Bores [place], [facility]

## 2019-05-27 ENCOUNTER — Ambulatory Visit: Payer: 59

## 2019-05-27 ENCOUNTER — Other Ambulatory Visit: Payer: Self-pay

## 2019-05-27 VITALS — Ht 70.0 in | Wt 240.0 lb

## 2019-05-27 DIAGNOSIS — Z86010 Personal history of colonic polyps: Secondary | ICD-10-CM

## 2019-05-27 DIAGNOSIS — Z8371 Family history of colonic polyps: Secondary | ICD-10-CM

## 2019-05-27 MED ORDER — CLENPIQ 10-3.5-12 MG-GM -GM/160ML PO SOLN: 1.0000 | Freq: Once | ORAL | Status: AC

## 2019-05-27 NOTE — Progress Notes (Signed)
Per pt, no allergies to soy or egg products.Pt not taking any weight loss meds or using  O2 at home.  Pt denies sedation problems. Pt refused emmi video.  The PV was done over the phone due to COVID-19. Verified pt's address and insurance. Reviewed medical hx and prep instructions and will mail paperwork to the pt today. Informed pt to call with any questions or changes prior to her procedure. Pt understood.

## 2019-06-01 DIAGNOSIS — E079 Disorder of thyroid, unspecified: Secondary | ICD-10-CM | POA: Diagnosis present

## 2019-06-01 DIAGNOSIS — K219 Gastro-esophageal reflux disease without esophagitis: Secondary | ICD-10-CM | POA: Diagnosis present

## 2019-06-01 DIAGNOSIS — M1711 Unilateral primary osteoarthritis, right knee: Secondary | ICD-10-CM | POA: Diagnosis present

## 2019-06-01 NOTE — H&P (Signed)
KNEE ARTHROPLASTY ADMISSION H&P  Patient ID: Stephanie Little MRN: [id number] DOB/AGE: [birth year] 54 y.o.  Chief Complaint: right knee pain.  Planned Procedure Date: 06/14/2019 Medical Clearance by Dr. Helene Kelp   HPI: Stephanie Little is a 52 y.o. female with a history of vitamin D deficiency, anxiety, GERD, HLD, and hypothyroidism who presents for evaluation of OA RIGHT KNEE. The patient has a history of pain and functional disability in the right knee due to arthritis and has failed non-surgical conservative treatments for greater than 12 weeks to include NSAID's and/or analgesics, corticosteriod injections and activity modification.  Onset of symptoms was gradual, starting 2 years ago with gradually worsening course since that time. Patient currently rates pain at 9 out of 10 with activity. Patient has night pain, worsening of pain with activity and weight bearing and pain that interferes with activities of daily living.  Patient has evidence of periarticular osteophytes and joint space narrowing by imaging studies.  Anterior medial disease with preserved lateral and patellofemoral space and good medial opening on stress view.  There is no active infection.  Past Medical History:  Diagnosis Date  . Anxiety    panic attacks  . Arthritis    right knee  . Ear discomfort    fluid in ears  . GERD (gastroesophageal reflux disease)   . Hemorrhoids   . Hx of colonic polyps   . Thyroid disease    Past Surgical History:  Procedure Laterality Date  . CESAREAN SECTION     one time  . CHOLECYSTECTOMY    . HERNIA REPAIR     hiatal hernia  . laproscopic gastric banding     Dr Hassell Done   No Known Allergies   Prior to Admission medications   Medication Sig Start Date End Date Taking? Authorizing Provider  citalopram (CELEXA) 10 MG tablet Take 10 mg by mouth daily.    [provider]  escitalopram (LEXAPRO) 5 MG tablet Take 5 mg by mouth daily.    [provider]  ibuprofen  (ADVIL,MOTRIN) 800 MG tablet Take 800 mg by mouth once as needed for pain.    [provider]  levothyroxine (SYNTHROID, LEVOTHROID) 88 MCG tablet Take 75 mcg by mouth daily before breakfast.     [provider]   Social History   Socioeconomic History  . Marital status: Married    Spouse name: Not on file  . Number of children: Not on file  . Years of education: Not on file  . Highest education level: Not on file  Occupational History  . Not on file  Social Needs  . Financial resource strain: Not on file  . Food insecurity    Worry: Not on file    Inability: Not on file  . Transportation needs    Medical: Not on file    Non-medical: Not on file  Tobacco Use  . Smoking status: Former Smoker    Types: Cigarettes    Quit date: 1997    Years since quitting: 23.5  . Smokeless tobacco: Never Used  Substance and Sexual Activity  . Alcohol use: No  . Drug use: No  . Sexual activity: Yes    Partners: Male    Comment: married  Lifestyle  . Physical activity    Days per week: Not on file    Minutes per session: Not on file  . Stress: Not on file  Relationships  . Social connections    Talks on phone: Not on file  Gets together: Not on file    Attends religious service: Not on file    Active member of club or organization: Not on file    Attends meetings of clubs or organizations: Not on file    Relationship status: Not on file  Other Topics Concern  . Not on file  Social History Narrative  . Not on file   Family History  Problem Relation Age of Onset  . Healthy Mother   . Prostate cancer Father   . Lung cancer Father   . Colon polyps Father   . Thyroid disease Sister     ROS: Currently denies lightheadedness, dizziness, Fever, chills, CP, SOB.   No personal history of DVT, PE, MI, or CVA. No loose teeth or dentures. All other systems have been reviewed and were otherwise currently negative with the exception of those mentioned in the HPI and as  above.  Objective: Vitals: Ht: 5'10 Wt: 244 Temp: 97.8 BP: 101/69 Pulse: 79 O2 94% on room air.   Physical Exam: General: Alert, NAD.  Antalgic Gait  HEENT: EOMI, Good Neck Extension  Pulm: No increased work of breathing.  Clear B/L A/P w/o crackle or wheeze.  CV: RRR, No m/g/r appreciated  GI: soft, NT, ND Neuro: Neuro without gross focal deficit.  Sensation intact distally Skin: No lesions in the area of chief complaint MSK/Surgical Site: Right knee w/o redness or effusion.  + Medial JLT. ROM 0-115.  5/5 strength in extension and flexion.  +EHL/FHL.  NVI.  Stable varus and valgus stress.    Imaging Review Plain radiographs demonstrate severe degenerative joint disease of the right knee.   Preoperative templating of the joint replacement has been completed, documented, and submitted to the Operating Room personnel in order to optimize intra-operative equipment management.  Assessment: OA RIGHT KNEE Principal Problem:   Primary osteoarthritis of right knee Active Problems:   GERD (gastroesophageal reflux disease)   Thyroid disease   Plan: Plan for Procedure(s): UNICOMPARTMENTAL KNEE  The patient history, physical exam, clinical judgement of the provider and imaging are consistent with end stage degenerative joint disease and unicompartmental joint arthroplasty is deemed medically necessary. The treatment options including medical management, injection therapy, and arthroplasty were discussed at length. The risks and benefits of Procedure(s): UNICOMPARTMENTAL KNEE were presented and reviewed.  The risks of nonoperative treatment, versus surgical intervention including but not limited to continued pain, aseptic loosening, stiffness, dislocation/subluxation, infection, bleeding, nerve injury, blood clots, cardiopulmonary complications, morbidity, mortality, among others were discussed. The patient verbalizes understanding and wishes to proceed with the plan.  Patient is being  admitted for inpatient treatment for surgery, pain control, PT, prophylactic antibiotics, VTE prophylaxis, progressive ambulation, ADL's and discharge planning.   Dental prophylaxis discussed and recommended for 2 years postoperatively.   The patient does meet the criteria for TXA which will be used perioperatively.    ASA 81 mg BID for DVT prophylaxis in addition to SCDs, and early ambulation.  Plan for Tylenol, Celebrex, Gabapentin, small oxycodone Rx for pain.  Robaxin for spasm.  Continue Prilosec otc.  She would like to discharge POD 0 in the evening if possible.    The patient is planning to be discharged home with OPPT in care of her husband Amina Menchaca: [phone].   Patient's anticipated LOS is less than 2 midnights, meeting these requirements: - Younger than 58 - Lives within 1 hour of care - Has a competent adult at home to recover with post-op recover - NO history  of  - Chronic pain requiring opiods  - Diabetes  - Coronary Artery Disease  - Heart failure  - Heart attack  - Stroke  - DVT/VTE  - Cardiac arrhythmia  - Respiratory Failure/COPD  - Renal failure  - Anemia  - Advanced Liver disease   Prudencio Burly III, PA-C 06/01/2019 2:37 PM

## 2019-06-03 ENCOUNTER — Encounter: Payer: Self-pay | Admitting: Gastroenterology

## 2019-06-09 ENCOUNTER — Telehealth: Payer: Self-pay | Admitting: Gastroenterology

## 2019-06-09 NOTE — Telephone Encounter (Signed)
Spoke with patient she answered no to all question

## 2019-06-09 NOTE — Telephone Encounter (Signed)

## 2019-06-10 ENCOUNTER — Other Ambulatory Visit: Payer: Self-pay

## 2019-06-10 ENCOUNTER — Encounter: Payer: Self-pay | Admitting: Gastroenterology

## 2019-06-10 ENCOUNTER — Ambulatory Visit (AMBULATORY_SURGERY_CENTER): Payer: 59 | Admitting: Gastroenterology

## 2019-06-10 VITALS — BP 114/59 | HR 60 | Temp 98.5°F | Resp 17 | Ht 70.0 in | Wt 240.0 lb

## 2019-06-10 DIAGNOSIS — Z8371 Family history of colonic polyps: Secondary | ICD-10-CM | POA: Diagnosis not present

## 2019-06-10 DIAGNOSIS — D122 Benign neoplasm of ascending colon: Secondary | ICD-10-CM

## 2019-06-10 DIAGNOSIS — K635 Polyp of colon: Secondary | ICD-10-CM | POA: Diagnosis not present

## 2019-06-10 DIAGNOSIS — Z86010 Personal history of colonic polyps: Secondary | ICD-10-CM

## 2019-06-10 MED ORDER — SODIUM CHLORIDE 0.9 % IV SOLN: 500.0000 mL | Freq: Once | INTRAVENOUS | Status: AC

## 2019-06-10 NOTE — Patient Instructions (Addendum)
YOU NEED TO HAVE A COVID 19 TEST ON_______ @_______ , THIS TEST MUST BE DONE BEFORE SURGERY, COME TO [facility] ENTRANCE. ONCE YOUR COVID TEST IS COMPLETED, PLEASE BEGIN THE QUARANTINE INSTRUCTIONS AS OUTLINED IN YOUR HANDOUT.                Stephanie Little    Your procedure is scheduled on: 06-22-2019   Report to [facility] Main  Entrance              Report to  [place] at 530 AM      Call this number if you have problems the morning of surgery [phone]    Remember:. BRUSH YOUR TEETH MORNING OF SURGERY AND RINSE YOUR MOUTH OUT, NO CHEWING GUM CANDY OR MINTS.   NO SOLID FOOD AFTER MIDNIGHT THE NIGHT PRIOR TO SURGERY. NOTHING BY MOUTH EXCEPT CLEAR LIQUIDS UNTIL 430 AM. PLEASE FINISH ENSURE DRINK PER SURGEON ORDER 3 HOURS PRIOR TO SCHEDULED SURGERY TIME WHICH NEEDS TO BE COMPLETED AT 430 AM.  CLEAR LIQUID DIET   Foods Allowed                                                                     Foods Excluded  Coffee and tea, regular and decaf                             liquids that you cannot  Plain Jell-O any favor except red or purple                                           see through such as: Fruit ices (not with fruit pulp)                                     milk, soups, orange juice  Iced Popsicles                                    All solid food Carbonated beverages, regular and diet                                    Cranberry, grape and apple juices Sports drinks like Gatorade Lightly seasoned clear broth or consume(fat free) Sugar, honey syrup  Sample Menu Breakfast                                Lunch                                     Supper Cranberry juice                    Beef broth  Chicken broth Jell-O                                     Grape juice                           Apple juice Coffee or tea                        Jell-O                                      Popsicle                                   Coffee or tea                        Coffee or tea  _____________________________________________________________________     Take these medicines the morning of surgery with A SIP OF WATER: lexapro, omeprazole, levothyroxine                                You may not have any metal on your body including hair pins and              piercings  Do not wear jewelry, make-up, lotions, powders or perfumes, deodorant             Do not wear nail polish.  Do not shave  48 hours prior to surgery.                Do not bring valuables to the hospital. [place].   Contacts, dentures or bridgework may not be worn into surgery.  ____________________________________________________________________           [facility] - Preparing for Surgery Before surgery, you can play an important role.  Because skin is not sterile, your skin needs to be as free of germs as possible.  You can reduce the number of germs on your skin by washing with CHG (chlorahexidine gluconate) soap before surgery.  CHG is an antiseptic cleaner which kills germs and bonds with the skin to continue killing germs even after washing. Please DO NOT use if you have an allergy to CHG or antibacterial soaps.  If your skin becomes reddened/irritated stop using the CHG and inform your nurse when you arrive at Short Stay. Do not shave (including legs and underarms) for at least 48 hours prior to the first CHG shower.  You may shave your face/neck. Please follow these instructions carefully:  1.  Shower with CHG Soap the night before surgery and the  morning of Surgery.  2.  If you choose to wash your hair, wash your hair first as usual with your  normal  shampoo.  3.  After you shampoo, rinse your hair and body thoroughly to remove the  shampoo.                           4.  Use CHG as you would any other liquid soap.  You can apply chg directly  to the  skin and wash                       Gently with a scrungie or clean washcloth.  5.  Apply the CHG Soap to your body ONLY FROM THE NECK DOWN.   Do not use on face/ open                           Wound or open sores. Avoid contact with eyes, ears mouth and genitals (private parts).                       Wash face,  Genitals (private parts) with your normal soap.             6.  Wash thoroughly, paying special attention to the area where your surgery  will be performed.  7.  Thoroughly rinse your body with warm water from the neck down.  8.  DO NOT shower/wash with your normal soap after using and rinsing off  the CHG Soap.                9.  Pat yourself dry with a clean towel.            10.  Wear clean pajamas.            11.  Place clean sheets on your bed the night of your first shower and do not  sleep with pets. Day of Surgery : Do not apply any lotions/deodorants the morning of surgery.  Please wear clean clothes to the hospital/surgery center.  FAILURE TO FOLLOW THESE INSTRUCTIONS MAY RESULT IN THE CANCELLATION OF YOUR SURGERY PATIENT SIGNATURE_________________________________  NURSE SIGNATURE__________________________________  ________________________________________________________________________   Stephanie Little  An incentive spirometer is a tool that can help keep your lungs clear and active. This tool measures how well you are filling your lungs with each breath. Taking long deep breaths may help reverse or decrease the chance of developing breathing (pulmonary) problems (especially infection) following:  A long period of time when you are unable to move or be active. BEFORE THE PROCEDURE   If the spirometer includes an indicator to show your best effort, your nurse or respiratory therapist will set it to a desired goal.  If possible, sit up straight or lean slightly forward. Try not to slouch.  Hold the incentive spirometer in an upright position. INSTRUCTIONS FOR  USE  1. Sit on the edge of your bed if possible, or sit up as far as you can in bed or on a chair. 2. Hold the incentive spirometer in an upright position. 3. Breathe out normally. 4. Place the mouthpiece in your mouth and seal your lips tightly around it. 5. Breathe in slowly and as deeply as possible, raising the piston or the ball toward the top of the column. 6. Hold your breath for 3-5 seconds or for as long as possible. Allow the piston or ball to fall to the bottom of the column. 7. Remove the mouthpiece from your mouth and breathe out normally. 8. Rest for a few seconds and repeat Steps 1 through 7 at least 10 times every 1-2 hours when you are awake. Take your time and take a few normal breaths between deep breaths. 9. The spirometer may include an indicator to show your  best effort. Use the indicator as a goal to work toward during each repetition. 10. After each set of 10 deep breaths, practice coughing to be sure your lungs are clear. If you have an incision (the cut made at the time of surgery), support your incision when coughing by placing a pillow or rolled up towels firmly against it. Once you are able to get out of bed, walk around indoors and cough well. You may stop using the incentive spirometer when instructed by your caregiver.  RISKS AND COMPLICATIONS  Take your time so you do not get dizzy or light-headed.  If you are in pain, you may need to take or ask for pain medication before doing incentive spirometry. It is harder to take a deep breath if you are having pain. AFTER USE  Rest and breathe slowly and easily.  It can be helpful to keep track of a log of your progress. Your caregiver can provide you with a simple table to help with this. If you are using the spirometer at home, follow these instructions: [place] IF:   You are having difficultly using the spirometer.  You have trouble using the spirometer as often as instructed.  Your pain medication  is not giving enough relief while using the spirometer.  You develop fever of 100.5 F (38.1 C) or higher. SEEK IMMEDIATE MEDICAL CARE IF:   You cough up bloody sputum that had not been present before.  You develop fever of 102 F (38.9 C) or greater.  You develop worsening pain at or near the incision site. MAKE SURE YOU:   Understand these instructions.  Will watch your condition.  Will get help right away if you are not doing well or get worse. Document Released: 03/24/2007 Document Revised: 02/03/2012 Document Reviewed: 05/25/2007 [facility] Patient Information 2014 [place], [place].   ________________________________________________________________________

## 2019-06-10 NOTE — Op Note (Signed)
[facility] Patient Name: Stephanie Little Procedure Date: 06/10/2019 9:21 AM MRN: [id number] Endoscopist: Jackquline Denmark , MD Age: 53 Referring MD:  Date of Birth: 1966/02/20 Gender: Female Account #: [id number] Procedure:                Colonoscopy Indications:              High risk colon cancer surveillance: Personal                            history of colonic polyps, family history of                            colonic polyps (dad) Medicines:                Monitored Anesthesia Care Procedure:                Pre-Anesthesia Assessment:                           - Prior to the procedure, a History and Physical                            was performed, and patient medications and                            allergies were reviewed. The patient's tolerance of                            previous anesthesia was also reviewed. The risks                            and benefits of the procedure and the sedation                            options and risks were discussed with the patient.                            All questions were answered, and informed consent                            was obtained. Prior Anticoagulants: The patient has                            taken no previous anticoagulant or antiplatelet                            agents. ASA Grade Assessment: II - A patient with                            mild systemic disease. After reviewing the risks                            and benefits, the patient was deemed in  satisfactory condition to undergo the procedure.                           After obtaining informed consent, the colonoscope                            was passed under direct vision. Throughout the                            procedure, the patient's blood pressure, pulse, and                            oxygen saturations were monitored continuously. The                            Colonoscope was introduced through the anus and                           advanced to the 2 cm into the ileum. The                            colonoscopy was performed without difficulty. The                            patient tolerated the procedure well. The quality                            of the bowel preparation was good. The terminal                            ileum, ileocecal valve, appendiceal orifice, and                            rectum were photographed. Scope In: 9:32:19 AM Scope Out: 9:47:52 AM Scope Withdrawal Time: 0 hours 11 minutes 48 seconds  Total Procedure Duration: 0 hours 15 minutes 33 seconds  Findings:                 A 6 mm polyp was found in the proximal ascending                            colon. The polyp was sessile. The polyp was removed                            with a cold snare. Resection and retrieval were                            complete.                           A few rare small-mouthed diverticula were found in                            the sigmoid colon.  Non-bleeding external and internal hemorrhoids were                            found during retroflexion and during perianal exam.                            The hemorrhoids were Grade III (internal                            hemorrhoids that prolapse but require manual                            reduction).                           The terminal ileum appeared normal.                           The exam was otherwise without abnormality on                            direct and retroflexion views. Complications:            No immediate complications. Estimated Blood Loss:     Estimated blood loss: none. Impression:               - One 6 mm polyp in the proximal ascending colon,                            removed with a cold snare. Resected and retrieved.                           - Minimal sigmoid diverticulosis                           - Non-bleeding external and internal hemorrhoids.                           -  Otherwise normal colonoscopy to TI. Recommendation:           - Patient has a contact number available for                            emergencies. The signs and symptoms of potential                            delayed complications were discussed with the                            patient. Return to normal activities tomorrow.                            Written discharge instructions were provided to the                            patient.                           -  Resume high-fiber diet.                           - Continue present medications.                           - Await pathology results.                           - Repeat colonoscopy for surveillance based on                            pathology results.                           - Return to GI clinic PRN.                           -If any problems with hemorrhoids, use Preparation                            H 1 twice daily after the bowel movement for 7 to                            10 days and let us know. Jackquline Denmark, MD 06/10/2019 9:53:20 AM This report has been signed electronically.

## 2019-06-10 NOTE — Progress Notes (Signed)
Called to room to assist during endoscopic procedure.  Patient ID and intended procedure confirmed with present staff. Received instructions for my participation in the procedure from the performing physician.  

## 2019-06-10 NOTE — Progress Notes (Signed)
To PACU, VSS. Report to Rn.tb 

## 2019-06-10 NOTE — Patient Instructions (Signed)
Impression/Recommendations:  Polyp handout given to patient. Diverticulosis handout given to patient. Hemorrhoid handout given to patient. High fiber diet handout given to patient.  Continue present medications. Await pathology results.  Repeat colonoscopy for surveillance.  Date to be determined after pathology results reviewed.  Return to GI clinic PRN  If any problems with hemorrhoids, use Preparation H twice daily after bowel movement for 7-10 days and let us know.  YOU HAD AN ENDOSCOPIC PROCEDURE TODAY AT [facility]:   Refer to the procedure report that was given to you for any specific questions about what was found during the examination.  If the procedure report does not answer your questions, please call your gastroenterologist to clarify.  If you requested that your care partner not be given the details of your procedure findings, then the procedure report has been included in a sealed envelope for you to review at your convenience later.  YOU SHOULD EXPECT: Some feelings of bloating in the abdomen. Passage of more gas than usual.  Walking can help get rid of the air that was put into your GI tract during the procedure and reduce the bloating. If you had a lower endoscopy (such as a colonoscopy or flexible sigmoidoscopy) you may notice spotting of blood in your stool or on the toilet paper. If you underwent a bowel prep for your procedure, you may not have a normal bowel movement for a few days.  Please Note:  You might notice some irritation and congestion in your nose or some drainage.  This is from the oxygen used during your procedure.  There is no need for concern and it should clear up in a day or so.  SYMPTOMS TO REPORT IMMEDIATELY:   Following lower endoscopy (colonoscopy or flexible sigmoidoscopy):  Excessive amounts of blood in the stool  Significant tenderness or worsening of abdominal pains  Swelling of the abdomen that is new, acute  Fever of  100F or higher For urgent or emergent issues, a gastroenterologist can be reached at any hour by calling [phone].   DIET:  We do recommend a small meal at first, but then you may proceed to your regular diet.  Drink plenty of fluids but you should avoid alcoholic beverages for 24 hours.  ACTIVITY:  You should plan to take it easy for the rest of today and you should NOT DRIVE or use heavy machinery until tomorrow (because of the sedation medicines used during the test).    FOLLOW UP: Our staff will call the number listed on your records 48-72 hours following your procedure to check on you and address any questions or concerns that you may have regarding the information given to you following your procedure. If we do not reach you, we will leave a message.  We will attempt to reach you two times.  During this call, we will ask if you have developed any symptoms of COVID 19. If you develop any symptoms (ie: fever, flu-like symptoms, shortness of breath, cough etc.) before then, please call [phone].  If you test positive for Covid 19 in the 2 weeks post procedure, please call and report this information to [place].    If any biopsies were taken you will be contacted by phone or by letter within the next 1-3 weeks.  Please call us at [phone] if you have not heard about the biopsies in 3 weeks.    SIGNATURES/CONFIDENTIALITY: You and/or your care partner have signed paperwork which will be entered  into your electronic medical record.  These signatures attest to the fact that that the information above on your After Visit Summary has been reviewed and is understood.  Full responsibility of the confidentiality of this discharge information lies with you and/or your care-partner.

## 2019-06-11 ENCOUNTER — Encounter (HOSPITAL_COMMUNITY): Payer: Self-pay

## 2019-06-11 ENCOUNTER — Encounter (HOSPITAL_COMMUNITY)
Admission: RE | Admit: 2019-06-11 | Discharge: 2019-06-11 | Disposition: A | Discharge status: Home / Self Care | Payer: 59 | Source: Ambulatory Visit | Attending: Orthopedic Surgery | Admitting: Orthopedic Surgery

## 2019-06-11 ENCOUNTER — Other Ambulatory Visit: Payer: Self-pay

## 2019-06-11 DIAGNOSIS — Z01812 Encounter for preprocedural laboratory examination: Secondary | ICD-10-CM | POA: Diagnosis present

## 2019-06-11 LAB — URINALYSIS, ROUTINE W REFLEX MICROSCOPIC
Bilirubin Urine: NEGATIVE
Glucose, UA: NEGATIVE mg/dL
Hgb urine dipstick: NEGATIVE
Ketones, ur: NEGATIVE mg/dL
Leukocytes,Ua: NEGATIVE
Nitrite: NEGATIVE
Protein, ur: NEGATIVE mg/dL
Specific Gravity, Urine: 1.027 (ref 1.005–1.030)
pH: 6 (ref 5.0–8.0)

## 2019-06-11 LAB — CBC
HCT: 45.2 % (ref 36.0–46.0)
Hemoglobin: 14.1 g/dL (ref 12.0–15.0)
MCH: 29 pg (ref 26.0–34.0)
MCHC: 31.2 g/dL (ref 30.0–36.0)
MCV: 93 fL (ref 80.0–100.0)
Platelets: 192 10*3/uL (ref 150–400)
RBC: 4.86 MIL/uL (ref 3.87–5.11)
RDW: 12.6 % (ref 11.5–15.5)
WBC: 3.9 10*3/uL — ABNORMAL LOW (ref 4.0–10.5)
nRBC: 0 % (ref 0.0–0.2)

## 2019-06-11 LAB — BASIC METABOLIC PANEL
Anion gap: 8 (ref 5–15)
BUN: 7 mg/dL (ref 6–20)
CO2: 31 mmol/L (ref 22–32)
Calcium: 9.4 mg/dL (ref 8.9–10.3)
Chloride: 103 mmol/L (ref 98–111)
Creatinine, Ser: 0.75 mg/dL (ref 0.44–1.00)
GFR calc Af Amer: >60 mL/min (ref >60)
GFR calc non Af Amer: >60 mL/min (ref >60)
Glucose, Bld: 91 mg/dL (ref 70–99)
Potassium: 3.6 mmol/L (ref 3.5–5.1)
Sodium: 142 mmol/L (ref 135–145)

## 2019-06-11 LAB — SURGICAL PCR SCREEN
MRSA, PCR: NEGATIVE
Staphylococcus aureus: NEGATIVE

## 2019-06-14 ENCOUNTER — Telehealth: Payer: Self-pay

## 2019-06-14 NOTE — Telephone Encounter (Signed)
  Follow up Call-  Call back number 06/10/2019  Post procedure Call Back phone  # cell [phone] home [phone]  Permission to leave phone message Yes  Some recent data might be hidden     Patient questions:  Do you have a fever, pain , or abdominal swelling? No. Pain Score  0 *  Have you tolerated food without any problems? Yes.    Have you been able to return to your normal activities? Yes.    Do you have any questions about your discharge instructions: Diet   No. Medications  No. Follow up visit  No.  Do you have questions or concerns about your Care? No.  Actions: * If pain score is 4 or above: No action needed, pain <4.  1. Have you developed a fever since your procedure? no  2.   Have you had an respiratory symptoms (SOB or cough) since your procedure? no  3.   Have you tested positive for COVID 19 since your procedure no  4.   Have you had any family members/close contacts diagnosed with the COVID 19 since your procedure?  no   If yes to any of these questions please route to Joylene John, RN and Alphonsa Gin, Therapist, sports.

## 2019-06-15 ENCOUNTER — Encounter: Payer: Self-pay | Admitting: Gastroenterology

## 2019-06-18 ENCOUNTER — Other Ambulatory Visit (HOSPITAL_COMMUNITY)
Admission: RE | Admit: 2019-06-18 | Discharge: 2019-06-18 | Disposition: A | Discharge status: Home / Self Care | Payer: 59 | Source: Ambulatory Visit | Attending: Orthopedic Surgery | Admitting: Orthopedic Surgery

## 2019-06-18 DIAGNOSIS — Z1159 Encounter for screening for other viral diseases: Secondary | ICD-10-CM | POA: Diagnosis present

## 2019-06-19 LAB — SARS CORONAVIRUS 2 (TAT 6-24 HRS): SARS Coronavirus 2: NEGATIVE

## 2019-06-21 MED ORDER — BUPIVACAINE LIPOSOME 1.3 % IJ SUSP
20.0000 mL | Freq: Once | INTRAMUSCULAR | Status: DC
  Filled 2019-06-21: qty 20

## 2019-06-21 NOTE — Anesthesia Preprocedure Evaluation (Addendum)
Anesthesia Evaluation  Patient identified by MRN, date of birth, ID band Patient awake    Reviewed: Allergy & Precautions, NPO status , Patient's Chart, lab work & pertinent test results  Airway Mallampati: I  TM Distance: >3 FB Neck ROM: Full    Dental no notable dental hx. (+) Teeth Intact, Dental Advisory Given   Pulmonary neg pulmonary ROS, former smoker,    Pulmonary exam normal breath sounds clear to auscultation       Cardiovascular Normal cardiovascular exam Rhythm:Regular Rate:Normal  HLD   Neuro/Psych PSYCHIATRIC DISORDERS Anxiety negative neurological ROS     GI/Hepatic Neg liver ROS, GERD  ,  Endo/Other  Hypothyroidism   Renal/GU negative Renal ROS  negative genitourinary   Musculoskeletal  (+) Arthritis ,   Abdominal   Peds  Hematology negative hematology ROS (+)   Anesthesia Other Findings   Reproductive/Obstetrics                            Anesthesia Physical Anesthesia Plan  ASA: II  Anesthesia Plan: Spinal and Regional   Post-op Pain Management:  Regional for Post-op pain   Induction:   PONV Risk Score and Plan: Treatment may vary due to age or medical condition, Midazolam, Dexamethasone and Ondansetron  Airway Management Planned: Natural Airway  Additional Equipment:   Intra-op Plan:   Post-operative Plan:   Informed Consent: I have reviewed the patients History and Physical, chart, labs and discussed the procedure including the risks, benefits and alternatives for the proposed anesthesia with the patient or authorized representative who has indicated his/her understanding and acceptance.     Dental advisory given  Plan Discussed with: CRNA  Anesthesia Plan Comments:         Anesthesia Quick Evaluation

## 2019-06-22 ENCOUNTER — Other Ambulatory Visit: Payer: Self-pay

## 2019-06-22 ENCOUNTER — Observation Stay (HOSPITAL_COMMUNITY): Payer: 59

## 2019-06-22 ENCOUNTER — Encounter (HOSPITAL_COMMUNITY): Payer: Self-pay | Admitting: *Deleted

## 2019-06-22 ENCOUNTER — Observation Stay (HOSPITAL_COMMUNITY)
Admission: RE | Admit: 2019-06-22 | Discharge: 2019-06-22 | Disposition: A | Discharge status: Home / Self Care | Payer: 59 | Source: Ambulatory Visit | Attending: Orthopedic Surgery | Admitting: Orthopedic Surgery

## 2019-06-22 ENCOUNTER — Ambulatory Visit (HOSPITAL_COMMUNITY): Payer: 59 | Admitting: Physician Assistant

## 2019-06-22 ENCOUNTER — Ambulatory Visit (HOSPITAL_COMMUNITY): Payer: 59 | Admitting: Anesthesiology

## 2019-06-22 ENCOUNTER — Encounter (HOSPITAL_COMMUNITY)
Admission: RE | Disposition: A | Discharge status: Home / Self Care | Payer: Self-pay | Source: Ambulatory Visit | Attending: Orthopedic Surgery

## 2019-06-22 DIAGNOSIS — Z79890 Hormone replacement therapy (postmenopausal): Secondary | ICD-10-CM | POA: Insufficient documentation

## 2019-06-22 DIAGNOSIS — F419 Anxiety disorder, unspecified: Secondary | ICD-10-CM | POA: Diagnosis not present

## 2019-06-22 DIAGNOSIS — Z96659 Presence of unspecified artificial knee joint: Secondary | ICD-10-CM

## 2019-06-22 DIAGNOSIS — E785 Hyperlipidemia, unspecified: Secondary | ICD-10-CM | POA: Insufficient documentation

## 2019-06-22 DIAGNOSIS — E079 Disorder of thyroid, unspecified: Secondary | ICD-10-CM | POA: Diagnosis present

## 2019-06-22 DIAGNOSIS — K219 Gastro-esophageal reflux disease without esophagitis: Secondary | ICD-10-CM | POA: Diagnosis not present

## 2019-06-22 DIAGNOSIS — M25761 Osteophyte, right knee: Secondary | ICD-10-CM | POA: Insufficient documentation

## 2019-06-22 DIAGNOSIS — Z87891 Personal history of nicotine dependence: Secondary | ICD-10-CM | POA: Insufficient documentation

## 2019-06-22 DIAGNOSIS — E559 Vitamin D deficiency, unspecified: Secondary | ICD-10-CM | POA: Diagnosis not present

## 2019-06-22 DIAGNOSIS — Z79899 Other long term (current) drug therapy: Secondary | ICD-10-CM | POA: Insufficient documentation

## 2019-06-22 DIAGNOSIS — M1711 Unilateral primary osteoarthritis, right knee: Principal | ICD-10-CM | POA: Insufficient documentation

## 2019-06-22 DIAGNOSIS — E039 Hypothyroidism, unspecified: Secondary | ICD-10-CM | POA: Diagnosis not present

## 2019-06-22 DIAGNOSIS — M1710 Unilateral primary osteoarthritis, unspecified knee: Secondary | ICD-10-CM | POA: Diagnosis present

## 2019-06-22 HISTORY — PX: PARTIAL KNEE ARTHROPLASTY: SHX2174

## 2019-06-22 SURGERY — ARTHROPLASTY, KNEE, UNICOMPARTMENTAL
Anesthesia: Regional | Laterality: Right

## 2019-06-22 MED ORDER — PROPOFOL 10 MG/ML IV BOLUS
INTRAVENOUS | Status: DC | PRN
  Administered 2019-06-22 (×2): 20 mg via INTRAVENOUS

## 2019-06-22 MED ORDER — TRANEXAMIC ACID-NACL 1000-0.7 MG/100ML-% IV SOLN
1000.0000 mg | INTRAVENOUS | Status: AC
  Administered 2019-06-22: 08:00:00 1000 mg via INTRAVENOUS

## 2019-06-22 MED ORDER — DEXAMETHASONE SODIUM PHOSPHATE 10 MG/ML IJ SOLN
INTRAMUSCULAR | Status: DC | PRN
  Administered 2019-06-22: 8 mg via INTRAVENOUS

## 2019-06-22 MED ORDER — POVIDONE-IODINE 10 % EX SWAB
2.0000 "application " | Freq: Once | CUTANEOUS | Status: AC
  Administered 2019-06-22: 2 via TOPICAL

## 2019-06-22 MED ORDER — SORBITOL 70 % SOLN
30.0000 mL | Freq: Every day | Status: DC | PRN
  Filled 2019-06-22: qty 30

## 2019-06-22 MED ORDER — GABAPENTIN 300 MG PO CAPS: 300.0000 mg | ORAL_CAPSULE | Freq: Two times a day (BID) | ORAL | 0 refills | Status: AC

## 2019-06-22 MED ORDER — MIDAZOLAM HCL 2 MG/2ML IJ SOLN
INTRAMUSCULAR | Status: AC
  Filled 2019-06-22: qty 2

## 2019-06-22 MED ORDER — LEVOTHYROXINE SODIUM 75 MCG PO TABS: 75.0000 ug | ORAL_TABLET | Freq: Every day | ORAL | Status: DC

## 2019-06-22 MED ORDER — SODIUM CHLORIDE 0.9 % IR SOLN
Status: DC | PRN
  Administered 2019-06-22: 1000 mL

## 2019-06-22 MED ORDER — ESCITALOPRAM OXALATE 10 MG PO TABS: 5.0000 mg | ORAL_TABLET | Freq: Every day | ORAL | Status: DC

## 2019-06-22 MED ORDER — ACETAMINOPHEN 500 MG PO TABS
1000.0000 mg | ORAL_TABLET | Freq: Three times a day (TID) | ORAL | Status: DC
  Administered 2019-06-22: 13:00:00 1000 mg via ORAL
  Filled 2019-06-22: qty 2

## 2019-06-22 MED ORDER — PHENOL 1.4 % MT LIQD
1.0000 | OROMUCOSAL | Status: DC | PRN
  Filled 2019-06-22: qty 177

## 2019-06-22 MED ORDER — ASPIRIN EC 81 MG PO TBEC: 81.0000 mg | DELAYED_RELEASE_TABLET | Freq: Two times a day (BID) | ORAL | 0 refills | Status: AC

## 2019-06-22 MED ORDER — FENTANYL CITRATE (PF) 100 MCG/2ML IJ SOLN: 25.0000 ug | INTRAMUSCULAR | Status: DC | PRN

## 2019-06-22 MED ORDER — BUPIVACAINE LIPOSOME 1.3 % IJ SUSP
INTRAMUSCULAR | Status: DC | PRN
  Administered 2019-06-22: 20 mL

## 2019-06-22 MED ORDER — PROPOFOL 500 MG/50ML IV EMUL
INTRAVENOUS | Status: DC | PRN
  Administered 2019-06-22: 75 ug/kg/min via INTRAVENOUS

## 2019-06-22 MED ORDER — DEXAMETHASONE SODIUM PHOSPHATE 10 MG/ML IJ SOLN: 10.0000 mg | Freq: Once | INTRAMUSCULAR | Status: DC

## 2019-06-22 MED ORDER — MAGNESIUM CITRATE PO SOLN: 1.0000 | Freq: Once | ORAL | Status: DC | PRN

## 2019-06-22 MED ORDER — DOCUSATE SODIUM 100 MG PO CAPS: 100.0000 mg | ORAL_CAPSULE | Freq: Two times a day (BID) | ORAL | 0 refills | Status: AC

## 2019-06-22 MED ORDER — ACETAMINOPHEN 325 MG PO TABS: 325.0000 mg | ORAL_TABLET | Freq: Four times a day (QID) | ORAL | Status: DC | PRN

## 2019-06-22 MED ORDER — SODIUM CHLORIDE 0.9 % IV SOLN
INTRAVENOUS | Status: DC | PRN
  Administered 2019-06-22: 40 ug/min via INTRAVENOUS

## 2019-06-22 MED ORDER — FENTANYL CITRATE (PF) 100 MCG/2ML IJ SOLN
INTRAMUSCULAR | Status: DC | PRN
  Administered 2019-06-22 (×2): 50 ug via INTRAVENOUS

## 2019-06-22 MED ORDER — ONDANSETRON HCL 4 MG PO TABS: 4.0000 mg | ORAL_TABLET | Freq: Three times a day (TID) | ORAL | 0 refills | Status: AC | PRN

## 2019-06-22 MED ORDER — PROPOFOL 10 MG/ML IV BOLUS
INTRAVENOUS | Status: AC
  Filled 2019-06-22: qty 20

## 2019-06-22 MED ORDER — METHOCARBAMOL 500 MG IVPB - SIMPLE MED
INTRAVENOUS | Status: AC
  Administered 2019-06-22: 10:00:00 500 mg
  Filled 2019-06-22: qty 50

## 2019-06-22 MED ORDER — SODIUM CHLORIDE FLUSH 0.9 % IV SOLN
INTRAVENOUS | Status: DC | PRN
  Administered 2019-06-22: 30 mL

## 2019-06-22 MED ORDER — OXYCODONE HCL 5 MG PO TABS: 5.0000 mg | ORAL_TABLET | ORAL | 0 refills | Status: AC | PRN

## 2019-06-22 MED ORDER — CEFAZOLIN SODIUM-DEXTROSE 2-4 GM/100ML-% IV SOLN
INTRAVENOUS | Status: AC
  Filled 2019-06-22: qty 100

## 2019-06-22 MED ORDER — DEXAMETHASONE SODIUM PHOSPHATE 10 MG/ML IJ SOLN
INTRAMUSCULAR | Status: AC
  Filled 2019-06-22: qty 1

## 2019-06-22 MED ORDER — CELECOXIB 200 MG PO CAPS: 400.0000 mg | ORAL_CAPSULE | Freq: Once | ORAL | Status: AC

## 2019-06-22 MED ORDER — METOCLOPRAMIDE HCL 5 MG/ML IJ SOLN: 5.0000 mg | Freq: Three times a day (TID) | INTRAMUSCULAR | Status: DC | PRN

## 2019-06-22 MED ORDER — ACETAMINOPHEN 500 MG PO TABS
ORAL_TABLET | ORAL | Status: AC
  Administered 2019-06-22: 06:00:00 1000 mg via ORAL
  Filled 2019-06-22: qty 2

## 2019-06-22 MED ORDER — SODIUM CHLORIDE (PF) 0.9 % IJ SOLN
INTRAMUSCULAR | Status: AC
  Filled 2019-06-22: qty 50

## 2019-06-22 MED ORDER — TRANEXAMIC ACID-NACL 1000-0.7 MG/100ML-% IV SOLN
INTRAVENOUS | Status: AC
  Filled 2019-06-22: qty 100

## 2019-06-22 MED ORDER — PANTOPRAZOLE SODIUM 40 MG PO TBEC
40.0000 mg | DELAYED_RELEASE_TABLET | Freq: Every day | ORAL | Status: DC
  Administered 2019-06-22: 40 mg via ORAL
  Filled 2019-06-22: qty 1

## 2019-06-22 MED ORDER — POLYETHYLENE GLYCOL 3350 17 G PO PACK: 17.0000 g | PACK | Freq: Every day | ORAL | Status: DC | PRN

## 2019-06-22 MED ORDER — METHOCARBAMOL 500 MG IVPB - SIMPLE MED
500.0000 mg | Freq: Four times a day (QID) | INTRAVENOUS | Status: DC | PRN
  Filled 2019-06-22: qty 50

## 2019-06-22 MED ORDER — CELECOXIB 200 MG PO CAPS
ORAL_CAPSULE | ORAL | Status: AC
  Administered 2019-06-22: 06:00:00 400 mg via ORAL
  Filled 2019-06-22: qty 2

## 2019-06-22 MED ORDER — MIDAZOLAM HCL 5 MG/5ML IJ SOLN
INTRAMUSCULAR | Status: DC | PRN
  Administered 2019-06-22 (×2): 1 mg via INTRAVENOUS

## 2019-06-22 MED ORDER — ONDANSETRON HCL 4 MG/2ML IJ SOLN
INTRAMUSCULAR | Status: DC | PRN
  Administered 2019-06-22: 4 mg via INTRAVENOUS

## 2019-06-22 MED ORDER — ACETAMINOPHEN 500 MG PO TABS: 1000.0000 mg | ORAL_TABLET | Freq: Once | ORAL | Status: DC

## 2019-06-22 MED ORDER — CEFAZOLIN SODIUM-DEXTROSE 2-4 GM/100ML-% IV SOLN
2.0000 g | INTRAVENOUS | Status: AC
  Administered 2019-06-22: 2 g via INTRAVENOUS

## 2019-06-22 MED ORDER — METHOCARBAMOL 500 MG PO TABS: 500.0000 mg | ORAL_TABLET | Freq: Four times a day (QID) | ORAL | Status: DC | PRN

## 2019-06-22 MED ORDER — BUPIVACAINE IN DEXTROSE 0.75-8.25 % IT SOLN
INTRATHECAL | Status: DC | PRN
  Administered 2019-06-22: 1.7 mL via INTRATHECAL

## 2019-06-22 MED ORDER — LACTATED RINGERS IV SOLN
INTRAVENOUS | Status: DC
  Administered 2019-06-22: 13:00:00 via INTRAVENOUS

## 2019-06-22 MED ORDER — HYDROMORPHONE HCL 1 MG/ML IJ SOLN: 0.5000 mg | INTRAMUSCULAR | Status: DC | PRN

## 2019-06-22 MED ORDER — PHENYLEPHRINE HCL (PRESSORS) 10 MG/ML IV SOLN
INTRAVENOUS | Status: AC
  Filled 2019-06-22: qty 1

## 2019-06-22 MED ORDER — DOCUSATE SODIUM 100 MG PO CAPS
100.0000 mg | ORAL_CAPSULE | Freq: Two times a day (BID) | ORAL | Status: DC
  Administered 2019-06-22: 100 mg via ORAL
  Filled 2019-06-22: qty 1

## 2019-06-22 MED ORDER — CHLORHEXIDINE GLUCONATE 4 % EX LIQD: 60.0000 mL | Freq: Once | CUTANEOUS | Status: DC

## 2019-06-22 MED ORDER — ACETAMINOPHEN 500 MG PO TABS: 1000.0000 mg | ORAL_TABLET | Freq: Three times a day (TID) | ORAL | 0 refills | Status: AC

## 2019-06-22 MED ORDER — DIPHENHYDRAMINE HCL 12.5 MG/5ML PO ELIX: 12.5000 mg | ORAL_SOLUTION | ORAL | Status: DC | PRN

## 2019-06-22 MED ORDER — LACTATED RINGERS IV SOLN
INTRAVENOUS | Status: DC
  Administered 2019-06-22: 06:00:00 via INTRAVENOUS

## 2019-06-22 MED ORDER — POVIDONE-IODINE 10 % EX SWAB: 2.0000 "application " | Freq: Once | CUTANEOUS | Status: DC

## 2019-06-22 MED ORDER — BUPIVACAINE HCL (PF) 0.5 % IJ SOLN
INTRAMUSCULAR | Status: DC | PRN
  Administered 2019-06-22: 20 mL via PERINEURAL

## 2019-06-22 MED ORDER — GABAPENTIN 300 MG PO CAPS: 300.0000 mg | ORAL_CAPSULE | Freq: Three times a day (TID) | ORAL | Status: DC

## 2019-06-22 MED ORDER — MENTHOL 3 MG MT LOZG: 1.0000 | LOZENGE | OROMUCOSAL | Status: DC | PRN

## 2019-06-22 MED ORDER — CEFAZOLIN SODIUM-DEXTROSE 1-4 GM/50ML-% IV SOLN
1.0000 g | Freq: Four times a day (QID) | INTRAVENOUS | Status: DC
  Administered 2019-06-22: 15:00:00 1 g via INTRAVENOUS
  Filled 2019-06-22 (×2): qty 50

## 2019-06-22 MED ORDER — CELECOXIB 200 MG PO CAPS: 200.0000 mg | ORAL_CAPSULE | Freq: Two times a day (BID) | ORAL | 0 refills | Status: AC

## 2019-06-22 MED ORDER — METOCLOPRAMIDE HCL 5 MG PO TABS: 5.0000 mg | ORAL_TABLET | Freq: Three times a day (TID) | ORAL | Status: DC | PRN

## 2019-06-22 MED ORDER — FENTANYL CITRATE (PF) 100 MCG/2ML IJ SOLN
INTRAMUSCULAR | Status: AC
  Filled 2019-06-22: qty 2

## 2019-06-22 MED ORDER — OXYCODONE HCL 5 MG PO TABS
5.0000 mg | ORAL_TABLET | ORAL | Status: DC | PRN
  Administered 2019-06-22: 5 mg via ORAL
  Filled 2019-06-22: qty 2

## 2019-06-22 MED ORDER — 0.9 % SODIUM CHLORIDE (POUR BTL) OPTIME
TOPICAL | Status: DC | PRN
  Administered 2019-06-22: 08:00:00 1000 mL

## 2019-06-22 MED ORDER — ONDANSETRON HCL 4 MG/2ML IJ SOLN: 4.0000 mg | Freq: Four times a day (QID) | INTRAMUSCULAR | Status: DC | PRN

## 2019-06-22 MED ORDER — ASPIRIN 81 MG PO CHEW: 81.0000 mg | CHEWABLE_TABLET | Freq: Two times a day (BID) | ORAL | Status: DC

## 2019-06-22 MED ORDER — CELECOXIB 200 MG PO CAPS: 200.0000 mg | ORAL_CAPSULE | Freq: Two times a day (BID) | ORAL | Status: DC

## 2019-06-22 MED ORDER — ACETAMINOPHEN 500 MG PO TABS: 1000.0000 mg | ORAL_TABLET | Freq: Once | ORAL | Status: AC

## 2019-06-22 MED ORDER — PROPOFOL 10 MG/ML IV BOLUS
INTRAVENOUS | Status: AC
  Filled 2019-06-22: qty 60

## 2019-06-22 MED ORDER — METHOCARBAMOL 500 MG PO TABS: 500.0000 mg | ORAL_TABLET | Freq: Three times a day (TID) | ORAL | 0 refills | Status: AC | PRN

## 2019-06-22 MED ORDER — ONDANSETRON HCL 4 MG PO TABS: 4.0000 mg | ORAL_TABLET | Freq: Four times a day (QID) | ORAL | Status: DC | PRN

## 2019-06-22 MED ORDER — ONDANSETRON HCL 4 MG/2ML IJ SOLN
INTRAMUSCULAR | Status: AC
  Filled 2019-06-22: qty 2

## 2019-06-22 SURGICAL SUPPLY — 55 items
APL PRP STRL LF DISP 70% ISPRP (MISCELLANEOUS) ×2
BEARING TIB MENISCAL RT SM 4MM (Joint) IMPLANT
BLADE SURG 15 STRL LF DISP TIS (BLADE) ×1 IMPLANT
BLADE SURG 15 STRL SS (BLADE) ×3
BNDG CMPR MED 10X6 ELC LF (GAUZE/BANDAGES/DRESSINGS) ×1
BNDG ELASTIC 6X10 VLCR STRL LF (GAUZE/BANDAGES/DRESSINGS) ×3 IMPLANT
BOWL SMART MIX CTS (DISPOSABLE) ×3 IMPLANT
BRNG TIB SM 4 PHS 3 RT MEN (Joint) ×1 IMPLANT
CEMENT BONE R 1X40 (Cement) ×2 IMPLANT
CHLORAPREP W/TINT 26 (MISCELLANEOUS) ×5 IMPLANT
CLOSURE STERI-STRIP 1/2X4 (GAUZE/BANDAGES/DRESSINGS) ×1
CLSR STERI-STRIP ANTIMIC 1/2X4 (GAUZE/BANDAGES/DRESSINGS) ×2 IMPLANT
COVER SURGICAL LIGHT HANDLE (MISCELLANEOUS) ×3 IMPLANT
COVER WAND RF STERILE (DRAPES) ×3 IMPLANT
CUFF TOURN SGL QUICK 34 (TOURNIQUET CUFF) ×3
CUFF TRNQT CYL 34X4.125X (TOURNIQUET CUFF) ×1 IMPLANT
DRAPE ARTHROSCOPY W/POUCH 114 (DRAPES) ×3 IMPLANT
DRAPE U-SHAPE 47X51 STRL (DRAPES) ×3 IMPLANT
DRSG MEPILEX BORDER 4X4 (GAUZE/BANDAGES/DRESSINGS) IMPLANT
DRSG MEPILEX BORDER 4X8 (GAUZE/BANDAGES/DRESSINGS) ×3 IMPLANT
ELECT REM PT RETURN 15FT ADLT (MISCELLANEOUS) ×3 IMPLANT
FORCEPS GRASP 3 PRONG 3.2FR (CUTTING FORCEPS) ×3 IMPLANT
GLOVE BIO SURGEON STRL SZ7.5 (GLOVE) ×6 IMPLANT
GLOVE BIOGEL PI IND STRL 8 (GLOVE) ×2 IMPLANT
GLOVE BIOGEL PI INDICATOR 8 (GLOVE) ×4
GOWN STRL REUS W/ TWL LRG LVL3 (GOWN DISPOSABLE) ×1 IMPLANT
GOWN STRL REUS W/ TWL XL LVL3 (GOWN DISPOSABLE) ×1 IMPLANT
GOWN STRL REUS W/TWL LRG LVL3 (GOWN DISPOSABLE) ×3
GOWN STRL REUS W/TWL XL LVL3 (GOWN DISPOSABLE) ×3
HANDPIECE INTERPULSE COAX TIP (DISPOSABLE) ×3
IMMOBILIZER KNEE 20 (SOFTGOODS) ×3
IMMOBILIZER KNEE 20 THIGH 36 (SOFTGOODS) ×1 IMPLANT
KIT TURNOVER KIT A (KITS) IMPLANT
MANIFOLD NEPTUNE II (INSTRUMENTS) ×3 IMPLANT
NS IRRIG 1000ML POUR BTL (IV SOLUTION) ×3 IMPLANT
PACK BLADE SAW RECIP 70 3 PT (BLADE) ×2 IMPLANT
PACK ICE MAXI GEL EZY WRAP (MISCELLANEOUS) ×3 IMPLANT
PACK TOTAL KNEE CUSTOM (KITS) ×3 IMPLANT
PEG FEMORAL PEGGED STRL SM (Knees) ×2 IMPLANT
PROTECTOR NERVE ULNAR (MISCELLANEOUS) ×3 IMPLANT
SET HNDPC FAN SPRY TIP SCT (DISPOSABLE) ×1 IMPLANT
SUCTION FRAZIER HANDLE 10FR (MISCELLANEOUS) ×2
SUCTION TUBE FRAZIER 10FR DISP (MISCELLANEOUS) ×1 IMPLANT
SUT MNCRL AB 4-0 PS2 18 (SUTURE) ×3 IMPLANT
SUT VIC AB 0 CT1 36 (SUTURE) ×3 IMPLANT
SUT VIC AB 1 CT1 36 (SUTURE) ×5 IMPLANT
SUT VIC AB 2-0 CT1 27 (SUTURE) ×3
SUT VIC AB 2-0 CT1 TAPERPNT 27 (SUTURE) ×1 IMPLANT
TRAY CATH 16FR W/PLASTIC CATH (SET/KITS/TRAYS/PACK) ×2 IMPLANT
TRAY FOLEY MTR SLVR 16FR STAT (SET/KITS/TRAYS/PACK) ×1 IMPLANT
TRAY TIBIAL OXFORD SZ C RT (Joint) ×2 IMPLANT
UNICOMPARTMENTAL KNEE MENISCAL (Joint) ×3 IMPLANT
WATER STERILE IRR 1000ML POUR (IV SOLUTION) ×2 IMPLANT
WRAP KNEE MAXI GEL POST OP (GAUZE/BANDAGES/DRESSINGS) ×2 IMPLANT
YANKAUER SUCT BULB TIP 10FT TU (MISCELLANEOUS) ×3 IMPLANT

## 2019-06-22 NOTE — Anesthesia Postprocedure Evaluation (Signed)
Anesthesia Post Note  Patient: Stephanie Little  Procedure(s) Performed: UNICOMPARTMENTAL KNEE (Right )     Patient location during evaluation: PACU Anesthesia Type: Regional and Spinal Level of consciousness: oriented and awake and alert Pain management: pain level controlled Vital Signs Assessment: post-procedure vital signs reviewed and stable Respiratory status: spontaneous breathing, respiratory function stable and patient connected to nasal cannula oxygen Cardiovascular status: blood pressure returned to baseline and stable Postop Assessment: no headache, no backache and no apparent nausea or vomiting Anesthetic complications: no    Last Vitals:  Vitals:   06/22/19 1135 06/22/19 1235  BP: (!) 149/96 (!) 141/80  Pulse: 64 65  Resp: 18 18  Temp: 36.6 C 36.7 C  SpO2: 98% 98%    Last Pain:  Vitals:   06/22/19 1335  TempSrc:   PainSc: 0-No pain                 Keaghan Bowens L Daleah Coulson

## 2019-06-22 NOTE — Evaluation (Signed)
Physical Therapy Evaluation Patient Details Name: Stephanie Little MRN: [id number] DOB: 1966/06/01 Today's Date: 06/22/2019   History of Present Illness  53 yo female s/p R UKR on 06/22/19. PMH includes anxiety, GERD, lap gastric banding.  Clinical Impression  Pt presents with mild R knee pain, decreased R knee ROM, post-operative RLE weakness, increased time and effort to perform mobility tasks. Pt to benefit from acute PT to address deficits. Pt ambulated 130 ft with RW with min guard to supervision level of assist, PT encouraging pt to take small steps for R knee stability and reinforcing proper placement in and use of RW. Pt tolerated ambulation well with no evidence of R knee instability. Pt with safe and proper use of stairs, with verbal cuing for sequencing provided. Pt also tolerated R UKR exercises well, as PT explained, demonstrated, and had pt practice HEP until satisfactory performance obtained. Pt educated on ankle pumps (20/hour) to perform at home upon d/c to increase circulation, to pt's tolerance and limited by pain. Pt to go to OPPT on Thursday, appropriate for d/c today from PT standpoint. All education completed, exercise handout administered, pt with no further questions. Pt to d/c home with husband today.        Follow Up Recommendations Follow surgeon's recommendation for DC plan and follow-up therapies;Supervision for mobility/OOB(OPPT on 7/30 at 0800)    Equipment Recommendations  None recommended by PT    Recommendations for Other Services       Precautions / Restrictions Precautions Precautions: Fall Restrictions Weight Bearing Restrictions: No Other Position/Activity Restrictions: WBAT      Mobility  Bed Mobility Overal bed mobility: Needs Assistance Bed Mobility: Supine to Sit     Supine to sit: Supervision;HOB elevated     General bed mobility comments: supervision for safety, increased time to perform. Verbal cuing for sequencing to  EOB.  Transfers Overall transfer level: Needs assistance Equipment used: Rolling walker (2 wheeled) Transfers: Sit to/from Stand Sit to Stand: Supervision;Min guard         General transfer comment: Supervision to min guard for safety, sit to stand x2 trials once from bed and once from toilet. Verbal cuing for hand placement initially, progressed to no verbal cuing needed during transfer.  Ambulation/Gait Ambulation/Gait assistance: Supervision;Min guard;+2 safety/equipment Gait Distance (Feet): 130 Feet Assistive device: Rolling walker (2 wheeled) Gait Pattern/deviations: Step-to pattern;Step-through pattern;Decreased stride length;Trunk flexed Gait velocity: decr, safe and steady   General Gait Details: Min guard to supervision level assist for safety. Verbal cuing for placement in RW, sequencing with small steps today and tomorrow for R knee stability, turning with RW.  Stairs Stairs: Yes Stairs assistance: Min guard Stair Management: Two rails;Step to pattern;Forwards Number of Stairs: 3(2x3 steps) General stair comments: min guard for safety. Verbal cuing for leading with LLE ascending and leading with RLE descending. Pt safe and steady with step navigation, no R knee instability noted.  Wheelchair Mobility    Modified Rankin (Stroke Patients Only)       Balance Overall balance assessment: Mild deficits observed, not formally tested                                           Pertinent Vitals/Pain Pain Assessment: 0-10 Pain Score: 1  Pain Location: R knee Pain Descriptors / Indicators: Sore Pain Intervention(s): Limited activity within patient's tolerance;Monitored during session;Premedicated before session;Repositioned;Ice applied  Home Living Family/patient expects to be discharged to:: Private residence Living Arrangements: Spouse/significant other Available Help at Discharge: Family Type of Home: House Home Access: Stairs to  enter Entrance Stairs-Rails: Psychiatric nurse of Steps: [place]: Two level;Able to live on main level with bedroom/bathroom Home Equipment: Gilford Rile - 2 wheels;Cane - single point;Bedside commode;Shower seat      Prior Function Level of Independence: Independent         Comments: Pt works with [employer] in [place] Hand: Right    Extremity/Trunk Assessment   Upper Extremity Assessment Upper Extremity Assessment: Overall WFL for tasks assessed    Lower Extremity Assessment Lower Extremity Assessment: Overall WFL for tasks assessed;RLE deficits/detail RLE Deficits / Details: suspected post-surgical weakness; able to perform ankle pumps, quad set, heel slide to 90*, SLR with 5* quad lag and no lift assist RLE Sensation: WNL;decreased light touch(decreased sensation of perineal area)    Cervical / Trunk Assessment Cervical / Trunk Assessment: Normal  Communication   Communication: No difficulties  Cognition Arousal/Alertness: Awake/alert Behavior During Therapy: WFL for tasks assessed/performed Overall Cognitive Status: Within Functional Limits for tasks assessed                                        General Comments      Exercises Total Joint Exercises Ankle Circles/Pumps: AROM;Both;20 reps;Seated Quad Sets: AROM;Right;10 reps;Seated Towel Squeeze: AROM;Both;10 reps;Seated Heel Slides: AROM;Right;10 reps;AAROM;Seated Hip ABduction/ADduction: AROM;Right;10 reps;Seated Straight Leg Raises: AROM;Right;5 reps;Seated Goniometric ROM: knee AAROM ~5-90*, limited by pain and stiffness   Assessment/Plan    PT Assessment All further PT needs can be met in the next venue of care  PT Problem List Decreased strength;Decreased mobility;Decreased range of motion;Decreased activity tolerance;Decreased balance;Decreased knowledge of use of DME;Pain       PT Treatment Interventions      PT Goals (Current  goals can be found in the Care Plan section)  Acute Rehab PT Goals Patient Stated Goal: go home PT Goal Formulation: With patient Time For Goal Achievement: 06/29/19 Potential to Achieve Goals: Good    Frequency     Barriers to discharge        Co-evaluation               AM-PAC PT "6 Clicks" Mobility  Outcome Measure Help needed turning from your back to your side while in a flat bed without using bedrails?: None Help needed moving from lying on your back to sitting on the side of a flat bed without using bedrails?: None Help needed moving to and from a bed to a chair (including a wheelchair)?: None Help needed standing up from a chair using your arms (e.g., wheelchair or bedside chair)?: None Help needed to walk in hospital room?: A Little Help needed climbing 3-5 steps with a railing? : A Little 6 Click Score: 22    End of Session Equipment Utilized During Treatment: Gait belt Activity Tolerance: Patient tolerated treatment well Patient left: in chair;with call bell/phone within reach Nurse Communication: Mobility status PT Visit Diagnosis: Other abnormalities of gait and mobility (R26.89);Difficulty in walking, not elsewhere classified (R26.2)    Time: 4431-5400 PT Time Calculation (min) (ACUTE ONLY): 35 min   Charges:   PT Evaluation $PT Eval Low Complexity: 1 Low PT Treatments $Gait Training: 8-22 mins  Julien Girt, PT Acute Rehabilitation Services Pager [phone]  Office [phone]  Roxine Caddy D Elonda Husky 06/22/2019, 2:00 PM

## 2019-06-22 NOTE — Discharge Summary (Signed)
Discharge Summary  Patient ID: Stephanie Little MRN: [id number] DOB/AGE: [birth year] 53 y.o.  Admit date: 06/22/2019 Discharge date: 06/22/2019  Admission Diagnoses:  Primary osteoarthritis of right knee  Discharge Diagnoses:  Principal Problem:   Primary osteoarthritis of right knee Active Problems:   GERD (gastroesophageal reflux disease)   Thyroid disease   Primary localized osteoarthritis of knee   Past Medical History:  Diagnosis Date  . Anxiety    panic attacks  . Arthritis    right knee  . Ear discomfort    fluid in ears  . GERD (gastroesophageal reflux disease)   . Hemorrhoids   . Hx of colonic polyps   . Thyroid disease     Surgeries: Procedure(s): UNICOMPARTMENTAL KNEE on 06/22/2019   Consultants (if any):   Discharged Condition: Improved  Hospital Course: Stephanie Little is an 53 y.o. female who was admitted 06/22/2019 with a diagnosis of Primary osteoarthritis of right knee and went to the operating room on 06/22/2019 and underwent the above named procedures.    She was given perioperative antibiotics:  Anti-infectives (From admission, onward)   Start     Dose/Rate Route Frequency Ordered Stop   06/22/19 1400  ceFAZolin (ANCEF) IVPB 1 g/50 mL premix     1 g 100 mL/hr over 30 Minutes Intravenous Every 6 hours 06/22/19 1054 06/23/19 0159   06/22/19 0600  ceFAZolin (ANCEF) IVPB 2g/100 mL premix     2 g 200 mL/hr over 30 Minutes Intravenous On call to O.R. 06/22/19 0534 06/22/19 0733   06/22/19 0543  ceFAZolin (ANCEF) 2-4 GM/100ML-% IVPB    Note to Pharmacy: Charmayne Sheer   : cabinet override      06/22/19 0543 06/22/19 4098    .  She was given sequential compression devices, early ambulation, and ASA for DVT prophylaxis.  She benefited maximally from the hospital stay and there were no complications.  Her goal and desire was to discharge day of surgery.  She was Cleared by PT, pain & nausea were controlled PO, she ambulated, voided, and tolerated diet.      Recent vital signs:  Blood pressure (!) 141/80, pulse 65, temperature 98 F (36.7 C), temperature source Oral, resp. rate 18, SpO2 98 %.   Recent laboratory studies:  Lab Results  Component Value Date   HGB 14.1 06/11/2019   HGB 11.9 (L) 02/24/2013   HGB 11.3 (L) 02/24/2013   Lab Results  Component Value Date   WBC 3.9 (L) 06/11/2019   PLT 192 06/11/2019   No results found for: INR Lab Results  Component Value Date   NA 142 06/11/2019   K 3.6 06/11/2019   CL 103 06/11/2019   CO2 31 06/11/2019   BUN 7 06/11/2019   CREATININE 0.75 06/11/2019   GLUCOSE 91 06/11/2019    Discharge Medications:   Allergies as of 06/22/2019   No Known Allergies     Medication List    STOP taking these medications   ibuprofen 800 MG tablet Commonly known as: ADVIL     TAKE these medications   acetaminophen 500 MG tablet Commonly known as: TYLENOL Take 2 tablets (1,000 mg total) by mouth every 8 (eight) hours for 10 days. For Pain.   aspirin EC 81 MG tablet Take 1 tablet (81 mg total) by mouth 2 (two) times daily. For DVT prophylaxis for 30 days after surgery.   celecoxib 200 MG capsule Commonly known as: CeleBREX Take 1 capsule (200 mg total) by mouth 2 (two)  times daily for 14 days. For 2 weeks post op for pain and inflammation.  Discontinue Ibuprofen or other Anti-inflammatory medicine when taking this medicine.   docusate sodium 100 MG capsule Commonly known as: Colace Take 1 capsule (100 mg total) by mouth 2 (two) times daily. To prevent constipation while taking pain medication.   escitalopram 5 MG tablet Commonly known as: LEXAPRO Take 5 mg by mouth daily.   gabapentin 300 MG capsule Commonly known as: Neurontin Take 1 capsule (300 mg total) by mouth 2 (two) times daily for 14 days. For 2 weeks post op for pain.   levothyroxine 75 MCG tablet Commonly known as: SYNTHROID Take 75 mcg by mouth daily before breakfast.   methocarbamol 500 MG tablet Commonly known  as: Robaxin Take 1 tablet (500 mg total) by mouth every 8 (eight) hours as needed for muscle spasms.   omeprazole 20 MG capsule Commonly known as: PRILOSEC Take 20 mg by mouth daily.   ondansetron 4 MG tablet Commonly known as: Zofran Take 1 tablet (4 mg total) by mouth every 8 (eight) hours as needed for nausea or vomiting.   oxyCODONE 5 MG immediate release tablet Commonly known as: Roxicodone Take 1 tablet (5 mg total) by mouth every 4 (four) hours as needed for breakthrough pain.       Diagnostic Studies: Dg Knee Right Port  Result Date: 06/22/2019 CLINICAL DATA:  53 year old female status post right knee surgery. EXAM: PORTABLE RIGHT KNEE - 1-2 VIEW COMPARISON:  None. FINDINGS: Portable AP and cross-table lateral views. Sequelae of medial right knee hemiarthroplasty. Small air-fluid level in the joint space on the cross-table lateral view. No unexpected osseous changes. Hardware appears intact and normally aligned. IMPRESSION: Medial right knee hemiarthroplasty with no adverse features. Electronically Signed   By: Genevie Ann M.D.   On: 06/22/2019 10:18    Disposition: Discharge disposition: 01-Home or [place], [place].   Specialty: Orthopedic Surgery Contact information: [address] [place] [address] [phone]            Signed: Prudencio Burly III PA-C 06/22/2019, 4:28 PM

## 2019-06-22 NOTE — Anesthesia Procedure Notes (Signed)
Anesthesia Regional Block: Adductor canal block   Pre-Anesthetic Checklist: ,, timeout performed, Correct Patient, Correct Site, Correct Laterality, Correct Procedure, Correct Position, site marked, Risks and benefits discussed,  Surgical consent,  Pre-op evaluation,  At surgeon's request and post-op pain management  Laterality: Right  Prep: Maximum Sterile Barrier Precautions used, chloraprep       Needles:  Injection technique: Single-shot  Needle Type: Echogenic Stimulator Needle     Needle Length: 9cm  Needle Gauge: 22     Additional Needles:   Procedures:,,,, ultrasound used (permanent image in chart),,,,  Narrative:  Start time: 06/22/2019 6:54 AM End time: 06/22/2019 7:04 AM Injection made incrementally with aspirations every 5 mL.  Performed by: Personally  Anesthesiologist: Freddrick March, MD  Additional Notes: Monitors applied. No increased pain on injection. No increased resistance to injection. Injection made in 5cc increments. Good needle visualization. Patient tolerated procedure well.

## 2019-06-22 NOTE — Discharge Instructions (Signed)

## 2019-06-22 NOTE — Op Note (Signed)
06/22/2019  8:40 AM  PATIENT:  Stephanie Little    PRE-OPERATIVE DIAGNOSIS:  OA RIGHT KNEE  POST-OPERATIVE DIAGNOSIS:  Same  PROCEDURE:  UNICOMPARTMENTAL KNEE  SURGEON:  Renette Butters, MD  PHYSICIAN ASSISTANT: Roxan Hockey, PA-C, he was present and scrubbed throughout the case, critical for completion in a timely fashion, and for retraction, instrumentation, and closure.   ANESTHESIA:   General  PREOPERATIVE INDICATIONS:  Stephanie Little is a  53 y.o. female with a diagnosis of OA RIGHT KNEE who failed conservative measures and elected for surgical management.    The risks benefits and alternatives were discussed with the patient preoperatively including but not limited to the risks of infection, bleeding, nerve injury, cardiopulmonary complications, blood clots, the need for revision surgery, among others, and the patient was willing to proceed.  OPERATIVE IMPLANTS: Biomet Oxford mobile bearing medial compartment arthroplasty. Femoral Component: small. Tibial tray: C, Size 4 poly.   OPERATIVE FINDINGS: Endstage grade 4 medial compartment osteoarthritis. No significant changes in the lateral or patellofemoral joint  OPERATIVE PROCEDURE: The patient was brought to the operating room placed in supine position. General anesthesia was administered. IV antibiotics were given. The lower extremity was placed in the legholder and prepped and draped in usual sterile fashion.  Time out was performed.  The leg was elevated and exsanguinated and the tourniquet was inflated. Anteromedial incision was performed, and I took care to preserve the MCL. Parapatellar incision was carried out, and the osteophytes were excised, along with the medial meniscus and a small portion of the fat pad.  The extra medullary tibial cutting jig was applied, using the spoon and the 76mm G-Clamp, and I took care to protect the anterior cruciate ligament insertion and the tibial spine. The medial collateral ligament  was also protected, and I resected my proximal tibia, matching the anatomic slope.   The proximal tibial bony cut was removed in one piece, and I turned my attention to the femur.  The intramedullary femoral rod was placed using the drill, and then using the appropriate reference, I assembled the femoral jig, setting my posterior cutting block. I resected my posterior femur, and then measured my gap.   I then used the mill to match the extension gap to the flexion gap. The gaps were then measured again with the appropriate feeler gauges. Once I had balanced flexion and extension gaps, I then completed the preparation of the femur.  I milled off the anterior aspect of the distal femur to prevent impingement. I also exposed the tibia, and selected the above-named component, and then used the cutting jig to prepare the keel slot on the tibia. I also used the awl to curette out the bone to complete the preparation of the keel. The back wall was intact.  I then placed trial components, and it was found to have excellent motion, and appropriate balance.  I then cemented the components into place, cementing the tibia first, removing all excess cement, and then cementing the femur.  All loose cement was removed.  The real polyethylene insert was applied manually, and the knee was taken through functional range of motion, and found to have excellent stability and restoration of joint motion, with excellent balance.  The wounds were irrigated copiously, and the parapatellar tissue closed with Vicryl, followed by Vicryl for the subcutaneous tissue, with routine closure with Steri-Strips and sterile gauze.  The tourniquet was released, and the patient was awakened and extubated and returned to PACU in  stable and satisfactory condition. There were no complications.  POSTOPERATIVE PLAN: DVT px will consist of SCD's, mobiliation and chemical px, WBAT     Renette Butters, MD

## 2019-06-22 NOTE — Transfer of Care (Signed)
Immediate Anesthesia Transfer of Care Note  Patient: Stephanie Little  Procedure(s) Performed: Procedure(s): UNICOMPARTMENTAL KNEE (Right)  Patient Location: PACU  Anesthesia Type:Spinal  Level of Consciousness:  sedated, patient cooperative and responds to stimulation  Airway & Oxygen Therapy:Patient Spontanous Breathing and Patient connected to face mask oxgen  Post-op Assessment:  Report given to PACU RN and Post -op Vital signs reviewed and stable  Post vital signs:  Reviewed and stable  Last Vitals:  Vitals:   06/22/19 0558  BP: 117/71  Pulse: 78  Resp: 16  Temp: 37 C  SpO2: 49%    Complications: No apparent anesthesia complications

## 2019-06-22 NOTE — Anesthesia Procedure Notes (Signed)
Spinal  Patient location during procedure: OR Start time: 06/22/2019 7:26 AM End time: 06/22/2019 7:30 AM Reason for block: at surgeon's request Staffing Resident/CRNA: Anne Fu, CRNA Performed: resident/CRNA  Preanesthetic Checklist Completed: patient identified, site marked, surgical consent, pre-op evaluation, timeout performed, IV checked, risks and benefits discussed and monitors and equipment checked Spinal Block Patient position: sitting Prep: DuraPrep Patient monitoring: heart rate, continuous pulse ox and blood pressure Approach: midline Location: L2-3 Injection technique: single-shot Needle Needle type: Pencan  Needle gauge: 24 G Needle length: 9 cm Assessment Sensory level: T6 Additional Notes  Functioning IV was confirmed and monitors were applied. Expiration date of kit checked and confirmed. Sterile prep and drape, including hand hygiene and sterile gloves were used. The patient was positioned and the spine was prepped. The skin was anesthetized with lidocaine.  Free flow of clear CSF was obtained prior to injecting local anesthetic into the CSF X 1 attempt.  The spinal needle aspirated freely following injection.  The needle was carefully withdrawn. Patient tolerated procedure well, without complications. Loss of motor and sensory on exam post injection.

## 2019-06-22 NOTE — Interval H&P Note (Signed)
I participated in the care of this patient and agree with the above history, physical and evaluation. I performed a review of the history and a physical exam as detailed   Palmer Shorey Daniel Kelina Beauchamp MD  

## 2019-06-23 ENCOUNTER — Encounter (HOSPITAL_COMMUNITY): Payer: Self-pay | Admitting: Orthopedic Surgery

## 2021-03-27 IMAGING — DX PORTABLE RIGHT KNEE - 1-2 VIEW
2 series · 2 of 2 positions shown · non-contrast
Comparison: None.

CLINICAL DATA: 52-year-old female status post right knee surgery.

EXAM:
PORTABLE RIGHT KNEE - 1-2 VIEW

[knee ap (1 of 2)]
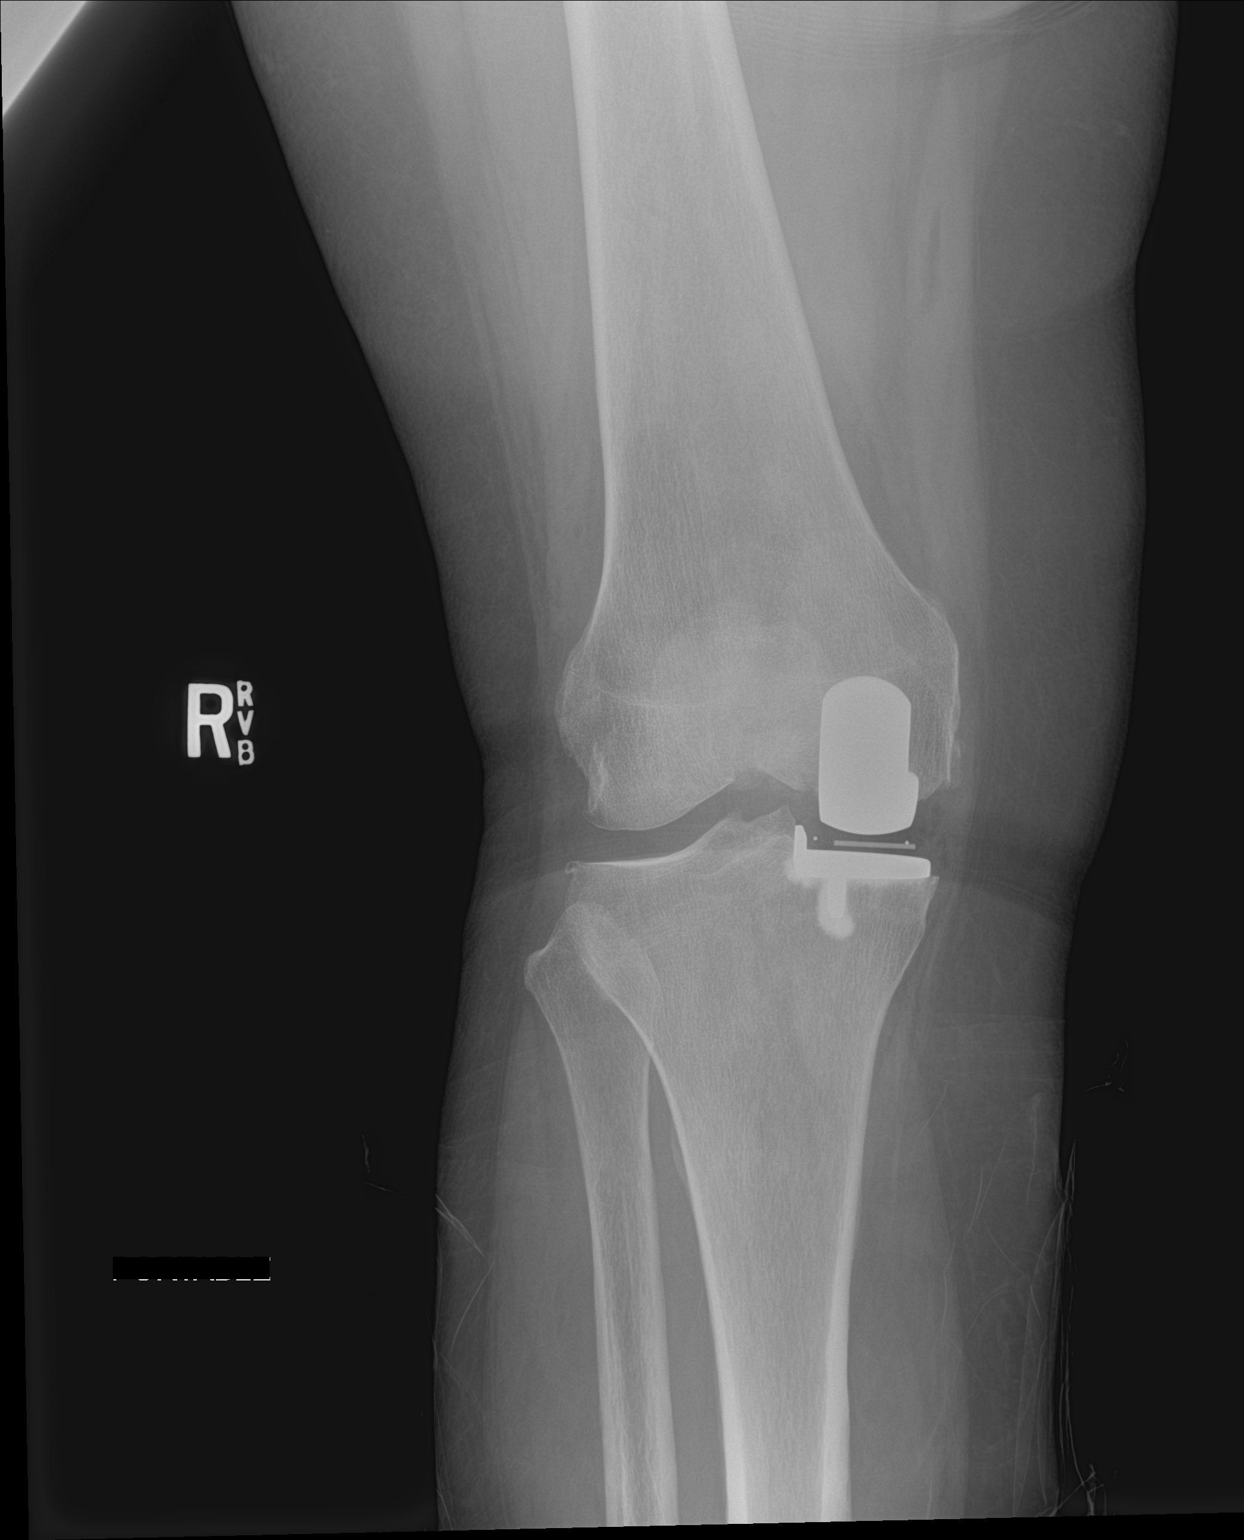

[knee ap (2 of 2)]
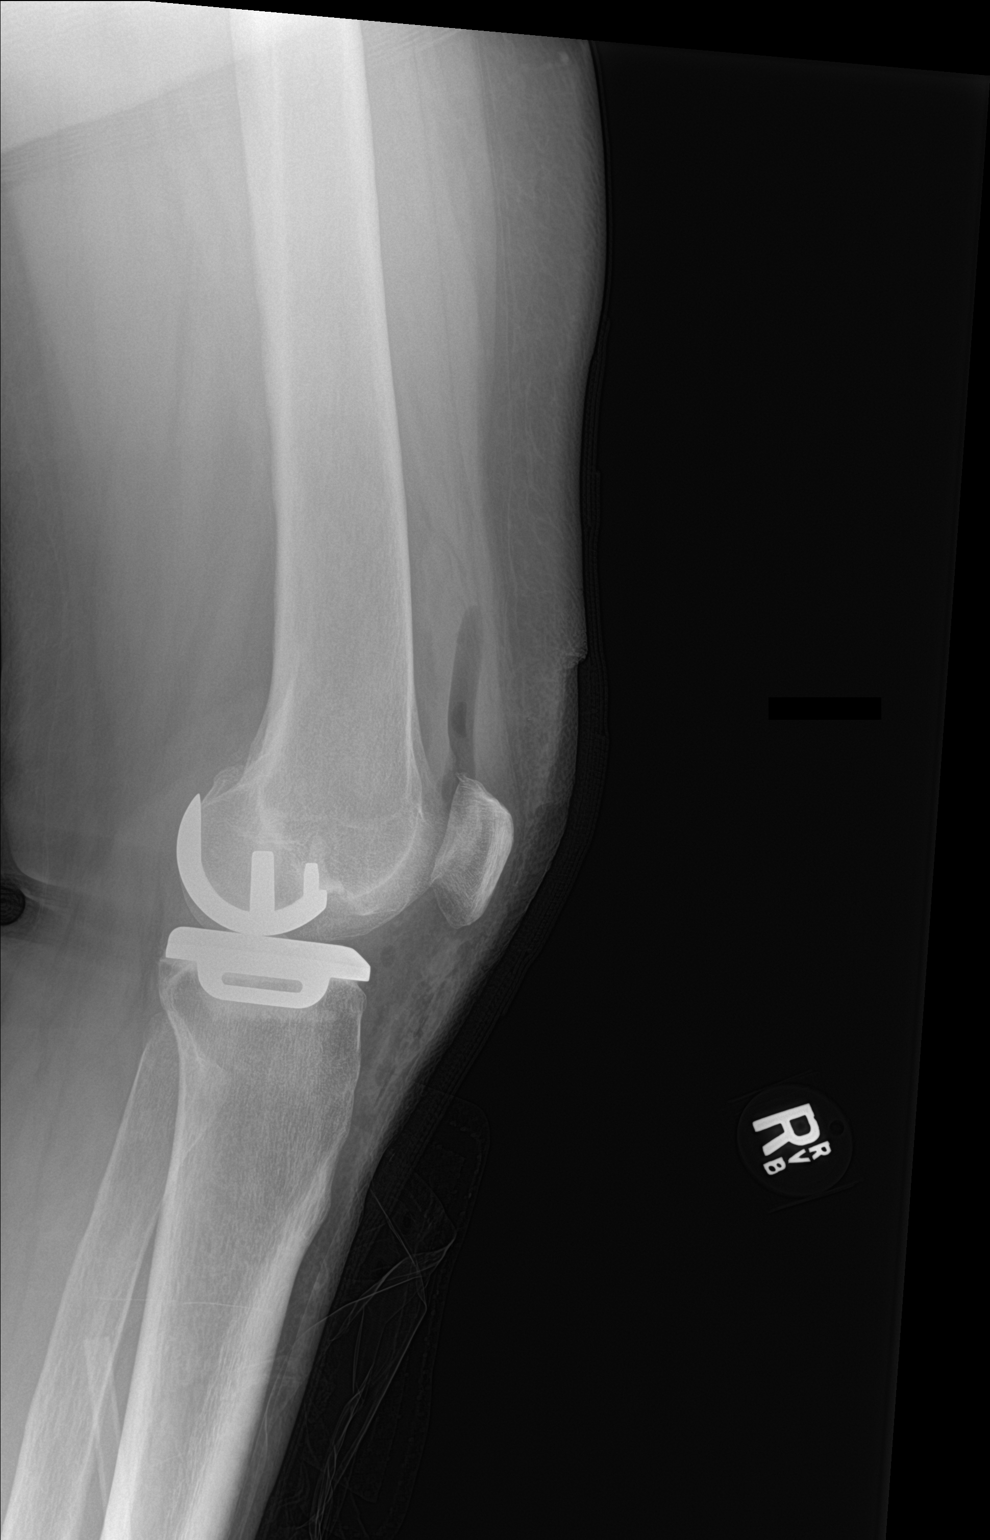

[2 of 2 positions shown; findings below may reference images not displayed]

FINDINGS: Portable AP and cross-table lateral views. Sequelae of medial right
knee hemiarthroplasty. Small air-fluid level in the joint space on
the cross-table lateral view. No unexpected osseous changes.
Hardware appears intact and normally aligned.
IMPRESSION: Medial right knee hemiarthroplasty with no adverse features.

## 2024-02-27 ENCOUNTER — Inpatient Hospital Stay: Payer: 59 | Attending: *Deleted | Admitting: Genetic Counselor

## 2024-02-27 ENCOUNTER — Inpatient Hospital Stay

## 2024-02-27 DIAGNOSIS — Z8042 Family history of malignant neoplasm of prostate: Secondary | ICD-10-CM

## 2024-02-27 DIAGNOSIS — Z8041 Family history of malignant neoplasm of ovary: Secondary | ICD-10-CM

## 2024-02-27 DIAGNOSIS — Z803 Family history of malignant neoplasm of breast: Secondary | ICD-10-CM | POA: Diagnosis not present

## 2024-02-27 LAB — GENETIC SCREENING ORDER

## 2024-02-27 NOTE — Progress Notes (Signed)
 REFERRING PROVIDER: Marylen Ponto, MD [address] [place] [address],   PRIMARY PROVIDER:  Marylen Ponto, MD  PRIMARY REASON FOR VISIT:  Encounter Diagnoses  Name Primary?   Family history of ovarian cancer Yes   Family history of breast cancer    Family history of prostate cancer     HISTORY OF PRESENT ILLNESS:   Stephanie Little, a 58 y.o. female, was seen for a [place] cancer genetics consultation at the request of Dr. Leonor Liv  due to a family history of primary peritoneal, breast, and other cancers.  Stephanie Little presents to clinic today to discuss the possibility of a hereditary predisposition to cancer, to discuss genetic testing, and to further clarify her future cancer risks, as well as potential cancer risks for family members.   Stephanie Little is a 58 y.o. female with no personal history of cancer.     SCREENING/RISK FACTORS:  Mammogram within the last year: yes.  No breast MRIs. Number of breast biopsies: 0. Colonoscopy: yes;  history of ~5 colon polyps .  Most recent in 2020; f/u  Hysterectomy: no.  Ovaries intact: yes.  Menarche was at age 89 or 44.  First live birth at age 12.  Menopausal status: postmenopausal; LMP ~47 or 48.  OCP use for approximately  1.5  years.  No hormonal IUD use. HRT use: 0 years. Dermatology screening: no  Blood transfusion within past 4 weeks: no   Past Medical History:  Diagnosis Date   Anxiety    panic attacks   Arthritis    right knee   Ear discomfort    fluid in ears   GERD (gastroesophageal reflux disease)    Hemorrhoids    Hx of colonic polyps    Thyroid disease     Past Surgical History:  Procedure Laterality Date   CESAREAN SECTION     one time   CHOLECYSTECTOMY     HERNIA REPAIR     hiatal hernia   laproscopic gastric banding     Dr Daphine Deutscher   PARTIAL KNEE ARTHROPLASTY Right 06/22/2019   Procedure: UNICOMPARTMENTAL KNEE;  Surgeon: Sheral Apley, MD;  Location: [facility];  Service: Orthopedics;  Laterality:  Right;     FAMILY HISTORY:  We obtained a detailed, 4-generation family history.  Significant diagnoses are listed below: Family History  Problem Relation Age of Onset   Cancer Mother 38       primary peritoneal   Prostate cancer Father        metastatic; d. 72   Colon polyps Father    Breast cancer Maternal Aunt 45   Breast cancer Paternal Aunt        dx 70s     Stephanie Little is unaware of previous family history of genetic testing for hereditary cancer risks.  Other relatives are unavailable for genetic testing at this time.   There is no reported Ashkenazi Jewish ancestry. There is no known consanguinity.  GENETIC COUNSELING ASSESSMENT: Stephanie Little is a 58 y.o. female with a family history of cancer which is somewhat suggestive of a hereditary cancer syndrome and predisposition to cancer given the presence of related cancers in the family (primary peritoneal and breast in maternal family; metastatic prostate and breast in paternal family). We, therefore, discussed and recommended the following at today's visit.   DISCUSSION: We discussed that 5 - 10% of cancer is hereditary, with most cases of hereditary breast, ovarian, prostate cancer associated with BRCA1/2.  There are other  genes that can be associated with hereditary breast, ovarian, prostate, or other cancer syndromes.  We discussed that testing is beneficial for several reasons, including knowing about other cancer risks, identifying potential screening and risk-reduction options that may be appropriate, and understanding if other family members could be at an increased risk for cancer and allowing them to undergo genetic testing.  Ms. Gindlesperger had several questions about CA-125. We reviewed the limitations of using CA-125 as a screening test and discussed that with some gene mutations that significantly raise ovarian cancer risk, a risk-reducing salpingo-oophorectomy is indicated.   We reviewed the characteristics, features and  inheritance patterns of hereditary cancer syndromes. We also discussed genetic testing, including the appropriate family members to test, the process of testing, insurance coverage and turn-around-time for results. We discussed the implications of a negative, positive, and variant of uncertain significant result. We discussed that negative results would be uninformative given that Stephanie Little does not have a personal history of cancer. We recommended Stephanie Little pursue genetic testing for a panel that contains genes associated with breast, ovarian, prostate, or other cancers.  Stephanie Little was offered a common hereditary cancer panel (~40 genes) and an expanded pan-cancer panel (~70 genes). Stephanie Little was informed of the benefits and limitations of each panel, including that expanded pan-cancer panels contain several genes that do not have clear management guidelines at this point in time.  We also discussed that as the number of genes included on a panel increases, the chances of variants of uncertain significance increases.  After considering the benefits and limitations of each gene panel, Stephanie Little elected to have an expanded Psychologist, occupational through [employer]   The CancerNext-Expanded gene panel offered by [facility] and includes sequencing, rearrangement, and RNA analysis for the following 76 genes: AIP, ALK, APC, ATM, AXIN2, BAP1, BARD1, BMPR1A, BRCA1, BRCA2, BRIP1, CDC73, CDH1, CDK4, CDKN1B, CDKN2A, CEBPA, CHEK2, CTNNA1, DDX41, DICER1, ETV6, FH, FLCN, GATA2, LZTR1, MAX, MBD4, MEN1, MET, MLH1, MSH2, MSH3, MSH6, MUTYH, NF1, NF2, NTHL1, PALB2, PHOX2B, PMS2, POT1, PRKAR1A, PTCH1, PTEN, RAD51C, RAD51D, RB1, RET, RUNX1, SDHA, SDHAF2, SDHB, SDHC, SDHD, SMAD4, SMARCA4, SMARCB1, SMARCE1, STK11, SUFU, TMEM127, TP53, TSC1, TSC2, VHL, and WT1 (sequencing and deletion/duplication); EGFR, HOXB13, KIT, MITF, PDGFRA, POLD1, and POLE (sequencing only); EPCAM and GREM1 (deletion/duplication only).   Based on [name]'s family history of metastatic prostate cancer in he deceased father, she meets NCCN medical criteria for genetic testing. Other relatives are unavailable for genetic testing at this time. Despite that she meets criteria, she may still have an out of pocket cost. We discussed that if her out of pocket cost for testing is over $100, the laboratory should contact them to discuss self-pay options and/or patient pay assistance programs.   We discussed the Genetic Information Non-Discrimination Act (GINA) of 2008, which helps protect individuals against genetic discrimination based on their genetic test results.  It impacts both health insurance and employment.  With health insurance, it protects against genetic test results being used for increased premiums or policy termination. For employment, it protects against hiring, firing and promoting decisions based on genetic test results.  GINA does not apply to those in the [employer], those who work for companies with less than 15 employees, and new life insurance or long-term disability insurance policies.  Health status due to a cancer diagnosis is not protected under GINA.  PLAN: After considering the risks, benefits, and limitations, Stephanie Little provided informed consent to pursue genetic testing and the  blood sample was sent to [facility] for analysis of the CancerNext-Expanded +RNAinsight Panel. Results should be available within approximately 3 weeks' time, at which point they will be disclosed by telephone to Stephanie Little, as will any additional recommendations warranted by these results. Stephanie Little will receive a summary of her genetic counseling visit and a copy of her results once available. This information will also be available in Epic.   Based on Stephanie Little's family history, we recommended her mother, who was diagnosed with primary peritoneal cancer as well as her paternal aunt who had breast cancer have genetic counseling and testing if they had  not already done so.  Stephanie Little will let us know if we can be of any assistance in coordinating genetic counseling and/or testing for appropriate family members.   Stephanie Little questions were answered to her satisfaction today. Our contact information was provided should additional questions or concerns arise. Thank you for the referral and allowing [place] to share in the care of your patient.   Arjan Strohm M. Rennie Plowman, MS, [facility] Genetic Counselor [email or web address] (P) [phone]    40 minutes were spent on the date of the encounter in service to the patient including preparation, face-to-face consultation, documentation and care coordination.  The patient was seen alone.  Drs. Gilman Buttner and/or Lewis were available to discuss this case as needed.   _______________________________________________________________________ For Office Staff:  Number of people involved in session: 1 Was an Intern/ student involved with case: no

## 2024-03-01 ENCOUNTER — Encounter: Payer: Self-pay | Admitting: Genetic Counselor

## 2024-03-29 ENCOUNTER — Telehealth: Payer: Self-pay | Admitting: Genetic Counselor

## 2024-03-29 ENCOUNTER — Ambulatory Visit: Payer: Self-pay | Admitting: Genetic Counselor

## 2024-03-29 DIAGNOSIS — Z1379 Encounter for other screening for genetic and chromosomal anomalies: Secondary | ICD-10-CM

## 2024-03-29 NOTE — Telephone Encounter (Signed)
 Called Stephanie Little to discuss results of genetic testing.  Patient at work and unable to talk.  Agreed to call back later today

## 2024-03-29 NOTE — Telephone Encounter (Signed)
 Disclosed negative genetics and VUS in BRIP1.  Discussed TC is 18% (no info on breast density; unable to obtain copy of mammo report-- requested patient provide).

## 2024-04-01 DIAGNOSIS — Z1379 Encounter for other screening for genetic and chromosomal anomalies: Secondary | ICD-10-CM | POA: Insufficient documentation

## 2024-04-01 NOTE — Progress Notes (Signed)
 HPI:   Stephanie Little was previously seen in the [place] [facility] due to a family history of breast cancer, primary peritoneal cancer, and other cancers and concerns regarding a hereditary predisposition to cancer.    Stephanie Little recent genetic test results were disclosed to her by telephone. These results and recommendations are discussed in more detail below.  CANCER HISTORY:  Oncology History   No history exists.    FAMILY HISTORY:  We obtained a detailed, 4-generation family history.  Significant diagnoses are listed below:      Family History  Problem Relation Age of Onset   Cancer Mother 2        primary peritoneal   Prostate cancer Father          metastatic; d. 9   Colon polyps Father     Breast cancer Maternal Aunt 85   Breast cancer Paternal Aunt          dx 69s       Stephanie Little is unaware of previous family history of genetic testing for hereditary cancer risks.  Other relatives are unavailable for genetic testing at this time.    There is no reported Ashkenazi Jewish ancestry. There is no known consanguinity.  GENETIC TEST RESULTS:  The [facility] CancerNext-Expanded +RNAinsight Panel found no pathogenic mutations.   The CancerNext-Expanded gene panel offered by [facility] and includes sequencing, rearrangement, and RNA analysis for the following 76 genes: AIP, ALK, APC, ATM, AXIN2, BAP1, BARD1, BMPR1A, BRCA1, BRCA2, BRIP1, CDC73, CDH1, CDK4, CDKN1B, CDKN2A, CEBPA, CHEK2, CTNNA1, DDX41, DICER1, ETV6, FH, FLCN, GATA2, LZTR1, MAX, MBD4, MEN1, MET, MLH1, MSH2, MSH3, MSH6, MUTYH, NF1, NF2, NTHL1, PALB2, PHOX2B, PMS2, POT1, PRKAR1A, PTCH1, PTEN, RAD51C, RAD51D, RB1, RET, RUNX1, SDHA, SDHAF2, SDHB, SDHC, SDHD, SMAD4, SMARCA4, SMARCB1, SMARCE1, STK11, SUFU, TMEM127, TP53, TSC1, TSC2, VHL, and WT1 (sequencing and deletion/duplication); EGFR, HOXB13, KIT, MITF, PDGFRA, POLD1, and POLE (sequencing only); EPCAM and GREM1 (deletion/duplication only).   The test report  has been scanned into EPIC and is located under the Molecular Pathology section of the Results Review tab.  A portion of the result report is included below for reference. Genetic testing reported out on March 09, 2024.     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  At this time, it is unknown if this variant is associated with an increased risk for cancer or if it is benign, but most uncertain variants are reclassified to benign. It should not be used to make medical management decisions. With time, we suspect the laboratory will determine the significance of this variant, if any. If the laboratory reclassifies this variant, we will attempt to contact Stephanie Little to discuss it further.   Even though a pathogenic variant was not identified, possible explanations for the cancer in the family may include: There may be no hereditary risk for cancer in the family. The cancers in Stephanie Little and/or her family may be sporadic/familial or due to other genetic and environmental factors.  Most cancer is not hereditary.  There may be a gene mutation in one of these genes that current testing methods cannot detect but that chance is small. There could be another gene that has not yet been discovered, or that we have not yet tested, that is responsible for the cancer diagnoses in the family.  It is also possible there is a hereditary cause for the cancer in the family that Stephanie Little  did not inherit. The variant of uncertain significance detected in the BRIP1 gene may be reclassified as a pathogenic variant in the future. At this time, we do not know if this variant increases the risk for cancer.  Therefore, it is important to remain in touch with cancer genetics in the future so that we can continue to offer Stephanie Little the most up to date genetic testing.     ADDITIONAL GENETIC TESTING:   Stephanie Little genetic testing was fairly extensive.  If  there are additional relevant genes identified to increase cancer risk that can be analyzed in the future, we would be happy to discuss and coordinate this testing at that time.    CANCER SCREENING RECOMMENDATIONS:  Stephanie Little test result is considered negative (normal).  This means that we have not identified a hereditary cause for her family history of cancer at this time.     An individual's cancer risk and medical management are not determined by genetic test results alone. Overall cancer risk assessment incorporates additional factors, including personal medical history, family history, and any available genetic information that may result in a personalized plan for cancer prevention and surveillance. Therefore, it is recommended she continue to follow the cancer management and screening guidelines provided by her  primary healthcare provider.  her lifetime risk for breast cancer based on Tyrer-Cuzick risk model and reported personal/family history is 18.2%.  This is elevated compared to the general population (7.7% for her age group) but not in the 'high risk for breast cancer' category per NCCN guidelines (>20%).  This risk estimate can change over time and should be repeated to reflect new information in her personal or family history in the future.  She should continue to remain diligent about annual breast imaging. Of note, breast density information was not available at time of calculation.  If she has dense breast tissue, her lifetime chance of breast cancer by this risk model would likely be >20%, at which point she should consider annual breast MRIs in addition to annual mammograms.  We requested she follow up with our office/her referring provider office after her next mammogram to discuss breast density information.      RECOMMENDATIONS FOR FAMILY MEMBERS:   Since she did not inherit a identifiable mutation in a cancer predisposition gene included on this panel, her children could not  have inherited a known mutation from her in one of these genes. Individuals in this family might be at some increased risk of developing cancer, over the general population risk, due to the family history of cancer.  Individuals in the family should notify their providers of the family history of cancer. We recommend women in this family have a yearly mammogram beginning at age 33, or 35 years younger than the earliest onset of cancer, an annual clinical breast exam, and perform monthly breast self-exams.  Risk models that take into account family history and hormonal history may be helpful in determining appropriate breast cancer screening options for family members.  Other members of the family may still carry a pathogenic variant in one of these genes that Stephanie Little did not inherit. Based on the family history, we recommend her mother, who was diagnosed with primary peritoneal cancer, and her paternal aunt, who has diagnosed with breast cancer, have genetic counseling and testing. Stephanie Little can let us  know if we can be of any assistance in coordinating genetic counseling and/or testing for these family members.   We do not recommend  familial testing for the BRIP1 variant of uncertain significance (VUS).  FOLLOW-UP:  Cancer genetics is a rapidly advancing field and it is possible that new genetic tests will be appropriate for her and/or her family members in the future. We encourage Stephanie Little to remain in contact with cancer genetics, so we can update her personal and family histories and let her know of advances in cancer genetics that may benefit this family.   Our contact number was provided.  They are welcome to call us  at anytime with additional questions or concerns.   Stephanie Little M. Ora Billing, MS, [facility] Genetic Counselor [email or web address] (P) [phone]
